# Patient Record
Sex: Female | Born: 1975 | Race: White | Hispanic: No | Marital: Married | Smoking: Current every day smoker
Health system: Southern US, Community
[De-identification: ages and names within clinical notes are randomized; demographics above are authoritative.]

## PROBLEM LIST (undated history)

## (undated) DIAGNOSIS — Z87898 Personal history of other specified conditions: Secondary | ICD-10-CM

## (undated) DIAGNOSIS — T883XXA Malignant hyperthermia due to anesthesia, initial encounter: Secondary | ICD-10-CM

## (undated) HISTORY — PX: VENTRICULOPERITONEAL SHUNT: SHX204

---

## 2004-09-06 ENCOUNTER — Inpatient Hospital Stay (HOSPITAL_COMMUNITY): Admission: AD | Admit: 2004-09-06 | Discharge: 2004-09-08 | Payer: Self-pay | Admitting: Obstetrics

## 2012-05-21 ENCOUNTER — Encounter (HOSPITAL_COMMUNITY): Payer: Self-pay | Admitting: *Deleted

## 2012-05-21 ENCOUNTER — Emergency Department (HOSPITAL_COMMUNITY)
Admission: EM | Admit: 2012-05-21 | Discharge: 2012-05-21 | Disposition: A | Discharge status: Home / Self Care | Payer: Self-pay | Attending: Emergency Medicine | Admitting: Emergency Medicine

## 2012-05-21 DIAGNOSIS — T148XXA Other injury of unspecified body region, initial encounter: Secondary | ICD-10-CM | POA: Insufficient documentation

## 2012-05-21 DIAGNOSIS — F17200 Nicotine dependence, unspecified, uncomplicated: Secondary | ICD-10-CM | POA: Insufficient documentation

## 2012-05-21 DIAGNOSIS — Y9289 Other specified places as the place of occurrence of the external cause: Secondary | ICD-10-CM | POA: Insufficient documentation

## 2012-05-21 DIAGNOSIS — Z982 Presence of cerebrospinal fluid drainage device: Secondary | ICD-10-CM | POA: Insufficient documentation

## 2012-05-21 DIAGNOSIS — X500XXA Overexertion from strenuous movement or load, initial encounter: Secondary | ICD-10-CM | POA: Insufficient documentation

## 2012-05-21 DIAGNOSIS — Z87898 Personal history of other specified conditions: Secondary | ICD-10-CM | POA: Insufficient documentation

## 2012-05-21 DIAGNOSIS — Y9389 Activity, other specified: Secondary | ICD-10-CM | POA: Insufficient documentation

## 2012-05-21 HISTORY — DX: Personal history of other specified conditions: Z87.898

## 2012-05-21 LAB — CBC
MCHC: 33.8 g/dL (ref 30.0–36.0)
RDW: 13.6 % (ref 11.5–15.5)

## 2012-05-21 LAB — COMPREHENSIVE METABOLIC PANEL
ALT: 40 U/L — ABNORMAL HIGH (ref 0–35)
Albumin: 4 g/dL (ref 3.5–5.2)
Alkaline Phosphatase: 59 U/L (ref 39–117)
BUN: 5 mg/dL — ABNORMAL LOW (ref 6–23)
Potassium: 3.8 mEq/L (ref 3.5–5.1)
Sodium: 138 mEq/L (ref 135–145)
Total Protein: 7.4 g/dL (ref 6.0–8.3)

## 2012-05-21 MED ORDER — DIAZEPAM 5 MG PO TABS: 5.0000 mg | ORAL_TABLET | Freq: Four times a day (QID) | ORAL | Status: DC | PRN

## 2012-05-21 MED ORDER — DIAZEPAM 5 MG PO TABS
5.0000 mg | ORAL_TABLET | Freq: Once | ORAL | Status: AC
  Administered 2012-05-21: 5 mg via ORAL
  Filled 2012-05-21: qty 1

## 2012-05-21 NOTE — ED Notes (Signed)
Pt stated that pain in r/lower abd. Is at an 8

## 2012-05-21 NOTE — ED Provider Notes (Signed)
History     CSN: [id number]  Arrival date & time 05/21/12  1013   First MD Initiated Contact with Patient 05/21/12 1051      Chief Complaint  Patient presents with  . Flank Pain    (Consider location/radiation/quality/duration/timing/severity/associated sxs/prior treatment) HPI  Danielle Hayden is a 37 y.o. female complaining of right lateral abdominal pain in both the upper and lower quadrant radiating around to the mid axillary line intermittently over the course of the last 4 days. Pain is described as sharp, severe at 8/10 is exacerbated by movement and lifting. Patient denies any fever, nausea vomiting, postprandial pain exacerbation, dysuria or frequency or change in bowel habits, cough or chest pain.  Past Medical History  Diagnosis Date  . H/O malignant hyperthermia     Past Surgical History  Procedure Laterality Date  . Ventriculoperitoneal shunt      No family history on file.  History  Substance Use Topics  . Smoking status: Current Every Day Smoker  . Smokeless tobacco: Not on file  . Alcohol Use: Yes     Comment: occasionally    OB History   Grav Para Term Preterm Abortions TAB SAB Ect Mult Living                  Review of Systems  Constitutional: Negative for fever.  Respiratory: Negative for shortness of breath.   Cardiovascular: Negative for chest pain.  Gastrointestinal: Negative for nausea, vomiting, abdominal pain and diarrhea.  All other systems reviewed and are negative.    Allergies  Review of patient's allergies indicates no known allergies.  Home Medications   Current Outpatient Rx  Name  Route  Sig  Dispense  Refill  . ibuprofen (ADVIL,MOTRIN) 200 MG tablet   Oral   Take 400 mg by mouth every 6 (six) hours as needed for pain (as needed for pain).         . diazepam (VALIUM) 5 MG tablet   Oral   Take 1 tablet (5 mg total) by mouth every 6 (six) hours as needed for anxiety (spasms).   10 tablet   0     BP 139/86  Pulse  85  Temp(Src) 97.9 F (36.6 C) (Oral)  Resp 18  SpO2 99%  Physical Exam  Nursing note and vitals reviewed. Constitutional: She is oriented to person, place, and time. She appears well-developed and well-nourished. No distress.  HENT:  Head: Normocephalic.  Mouth/Throat: Oropharynx is clear and moist.  Eyes: Conjunctivae and EOM are normal. Pupils are equal, round, and reactive to light.  Neck: Normal range of motion.  Cardiovascular: Normal rate, regular rhythm and normal heart sounds.   Pulmonary/Chest: Effort normal and breath sounds normal. No stridor. No respiratory distress. She has no wheezes. She has no rales. She exhibits no tenderness.  Abdominal: Soft. Bowel sounds are normal. She exhibits no distension and no mass. There is no tenderness. There is no rebound and no guarding.    Genitourinary:  NO CVA tenderness bilaterally   Musculoskeletal: Normal range of motion.  Neurological: She is alert and oriented to person, place, and time.  Psychiatric: She has a normal mood and affect.    ED Course  Procedures (including critical care time)  Labs Reviewed  CBC - Abnormal; Notable for the following:    WBC 11.9 (*)    All other components within normal limits  COMPREHENSIVE METABOLIC PANEL - Abnormal; Notable for the following:    BUN 5 (*)  ALT 40 (*)    Total Bilirubin 0.2 (*)    All other components within normal limits   No results found.   1. Muscle strain       MDM  Patient with diffuse tenderness to palpation of the far lateral right right-sided abdomen both upper and lower quadrants. Patient has no associated symptoms. The pain to be superficial and exacerbated by movement.   Blood work shows mild leukocytosis of 11.9. Liver function tests and alkaline phosphataseand  T. bili are normal.   Pt verbalized understanding and agrees with care plan. Outpatient follow-up and return precautions given.    Discharge Medication List as of 05/21/2012 12:46 PM     START taking these medications   Details  diazepam (VALIUM) 5 MG tablet Take 1 tablet (5 mg total) by mouth every 6 (six) hours as needed for anxiety (spasms)., Starting 05/21/2012, Until Discontinued, Print              Wynetta Emery, PA-C 05/21/12 [address] PA-C 05/21/12 1558

## 2012-05-21 NOTE — Progress Notes (Signed)
During [facility] ED visit referral to [facility] community care liaison who spoke with pt to confirm no pcp, no coverage and [place] resident She discussed and provided written information for list of self pay [place] pcps and affordable care act marketplace enrollment information   

## 2012-05-21 NOTE — ED Notes (Signed)
Pt reports a stabbing, cramping feeling in R side/flank. Started Wednesday. Got better and went back to work today and when lifting kids, pain returned. Pain worse with a deep breath. Denies known injury, denies urinary symptoms.

## 2012-05-23 NOTE — ED Provider Notes (Signed)
Medical screening examination/treatment/procedure(s) were performed by non-physician practitioner and as supervising physician I was immediately available for consultation/collaboration.   Suzi Roots, MD 05/23/12 [phone]

## 2013-01-14 ENCOUNTER — Emergency Department (HOSPITAL_COMMUNITY): Payer: Self-pay

## 2013-01-14 ENCOUNTER — Encounter (HOSPITAL_COMMUNITY): Payer: Self-pay | Admitting: Emergency Medicine

## 2013-01-14 ENCOUNTER — Emergency Department (HOSPITAL_COMMUNITY)
Admission: EM | Admit: 2013-01-14 | Discharge: 2013-01-15 | Disposition: A | Discharge status: Home / Self Care | Payer: Self-pay | Attending: Emergency Medicine | Admitting: Emergency Medicine

## 2013-01-14 DIAGNOSIS — K219 Gastro-esophageal reflux disease without esophagitis: Secondary | ICD-10-CM | POA: Insufficient documentation

## 2013-01-14 DIAGNOSIS — R109 Unspecified abdominal pain: Secondary | ICD-10-CM

## 2013-01-14 DIAGNOSIS — Z3202 Encounter for pregnancy test, result negative: Secondary | ICD-10-CM | POA: Insufficient documentation

## 2013-01-14 DIAGNOSIS — F17200 Nicotine dependence, unspecified, uncomplicated: Secondary | ICD-10-CM | POA: Insufficient documentation

## 2013-01-14 HISTORY — DX: Malignant hyperthermia due to anesthesia, initial encounter: T88.3XXA

## 2013-01-14 LAB — COMPREHENSIVE METABOLIC PANEL
ALT: 47 U/L — ABNORMAL HIGH (ref 0–35)
AST: 33 U/L (ref 0–37)
Albumin: 4.5 g/dL (ref 3.5–5.2)
BUN: 10 mg/dL (ref 6–23)
CO2: 21 mEq/L (ref 19–32)
Calcium: 9.5 mg/dL (ref 8.4–10.5)
Creatinine, Ser: 0.71 mg/dL (ref 0.50–1.10)
GFR calc Af Amer: >90 mL/min (ref >90)
GFR calc non Af Amer: >90 mL/min (ref >90)
Glucose, Bld: 180 mg/dL — ABNORMAL HIGH (ref 70–99)
Sodium: 136 mEq/L (ref 135–145)
Total Protein: 7.8 g/dL (ref 6.0–8.3)

## 2013-01-14 LAB — URINE MICROSCOPIC-ADD ON

## 2013-01-14 LAB — CBC WITH DIFFERENTIAL/PLATELET
Basophils Absolute: 0 10*3/uL (ref 0.0–0.1)
Basophils Relative: 0 % (ref 0–1)
Eosinophils Absolute: 0 10*3/uL (ref 0.0–0.7)
Eosinophils Relative: 0 % (ref 0–5)
HCT: 43.1 % (ref 36.0–46.0)
MCH: 32 pg (ref 26.0–34.0)
MCHC: 35 g/dL (ref 30.0–36.0)
MCV: 91.3 fL (ref 78.0–100.0)
Monocytes Absolute: 0.7 10*3/uL (ref 0.1–1.0)
Neutrophils Relative %: 87 % — ABNORMAL HIGH (ref 43–77)
Platelets: 419 10*3/uL — ABNORMAL HIGH (ref 150–400)
RBC: 4.72 MIL/uL (ref 3.87–5.11)
RDW: 13.3 % (ref 11.5–15.5)

## 2013-01-14 LAB — URINALYSIS, ROUTINE W REFLEX MICROSCOPIC
Glucose, UA: NEGATIVE mg/dL
Hgb urine dipstick: NEGATIVE
Ketones, ur: >80 mg/dL — AB
Protein, ur: 30 mg/dL — AB
Specific Gravity, Urine: 1.032 — ABNORMAL HIGH (ref 1.005–1.030)
Urobilinogen, UA: 1 mg/dL (ref 0.0–1.0)

## 2013-01-14 LAB — POCT PREGNANCY, URINE: Preg Test, Ur: NEGATIVE

## 2013-01-14 MED ORDER — ONDANSETRON HCL 4 MG/2ML IJ SOLN
4.0000 mg | Freq: Once | INTRAMUSCULAR | Status: AC
  Administered 2013-01-14: 4 mg via INTRAVENOUS
  Filled 2013-01-14: qty 2

## 2013-01-14 MED ORDER — HYDROMORPHONE HCL PF 1 MG/ML IJ SOLN
1.0000 mg | Freq: Once | INTRAMUSCULAR | Status: AC
  Administered 2013-01-15: 1 mg via INTRAVENOUS
  Filled 2013-01-14: qty 1

## 2013-01-14 MED ORDER — GI COCKTAIL ~~LOC~~
30.0000 mL | Freq: Once | ORAL | Status: AC
  Administered 2013-01-15: 30 mL via ORAL
  Filled 2013-01-14: qty 30

## 2013-01-14 MED ORDER — PANTOPRAZOLE SODIUM 40 MG IV SOLR
40.0000 mg | INTRAVENOUS | Status: AC
  Administered 2013-01-15: 40 mg via INTRAVENOUS
  Filled 2013-01-14: qty 40

## 2013-01-14 MED ORDER — HYDROMORPHONE HCL PF 1 MG/ML IJ SOLN
1.0000 mg | Freq: Once | INTRAMUSCULAR | Status: AC
  Administered 2013-01-14: 1 mg via INTRAVENOUS
  Filled 2013-01-14: qty 1

## 2013-01-14 MED ORDER — SODIUM CHLORIDE 0.9 % IV BOLUS (SEPSIS)
1000.0000 mL | Freq: Once | INTRAVENOUS | Status: AC
  Administered 2013-01-14: 1000 mL via INTRAVENOUS

## 2013-01-14 NOTE — Progress Notes (Signed)
Patient confirms that she does not have a pcp or insurance.  EDCM providedpatient a list of pcps who accept selfpay patients, list of discounted pharmacies and website needymeds.org for medication assistance, financial assistance in the community such as [employer], information regarding Affordable Care act and Medicaid for insurance, crisis intervention information.  EDCM placed this infromation with her belongings on the chair next to her bed.

## 2013-01-14 NOTE — ED Notes (Signed)
Per pt's husband, the pt began vomiting and having pain in her middle chest area at around [facility] today. Pt states she has been vomiting every 5 minutes for the past 5 hours. Pt states her hand's have also become numb within the last hour. Denies SOB.

## 2013-01-14 NOTE — ED Provider Notes (Signed)
CSN: [id number]     Arrival date & time 01/14/13  1908 History   First MD Initiated Contact with Patient 01/14/13 1935     Chief Complaint  Patient presents with  . Emesis   (Consider location/radiation/quality/duration/timing/severity/associated sxs/prior Treatment) HPI Comments: Patient presents to the emergency department with chief complaint of nausea, vomiting, and upper abdominal pain that began around 2 PM today. Patient states the pain is severe. She states that she has been vomiting continuously. She denies any hematemesis or hematochezia. States her last menstrual period was 8 years ago. She has IUD. She denies any other, surgeries. 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 She does not have a primary care provider. She denies any other symptoms at this time. She has not tried taking anything to alleviate her symptoms.  The history is provided by the patient. No language interpreter was used.    Past Medical History  Diagnosis Date  . Malignant hyperthermia    History reviewed. No pertinent past surgical history. No family history on file. History  Substance Use Topics  . Smoking status: Current Every Day Smoker -- 0.50 packs/day    Types: Cigarettes  . Smokeless tobacco: Not on file  . Alcohol Use: Yes   OB History   Grav Para Term Preterm Abortions TAB SAB Ect Mult Living                 Review of Systems  All other systems reviewed and are negative.    Allergies  Review of patient's allergies indicates no known allergies.  Home Medications   Current Outpatient Rx  Name  Route  Sig  Dispense  Refill  . ibuprofen (ADVIL,MOTRIN) 200 MG tablet   Oral   Take 200 mg by mouth every 6 (six) hours as needed for pain.          BP 139/97  Pulse 66  Temp(Src) 97.6 F (36.4 C) (Oral)  Resp 20 Physical Exam  Nursing note and vitals reviewed. Constitutional: She is oriented to person, place, and time. She appears  well-developed and well-nourished.  HENT:  Head: Normocephalic and atraumatic.  Eyes: Conjunctivae and EOM are normal. Pupils are equal, round, and reactive to light.  Neck: Normal range of motion. Neck supple.  Cardiovascular: Normal rate and regular rhythm.  Exam reveals no gallop and no friction rub.   No murmur heard. Pulmonary/Chest: Effort normal and breath sounds normal. No respiratory distress. She has no wheezes. She has no rales. She exhibits no tenderness.  Abdominal: Soft. Bowel sounds are normal. She exhibits no distension and no mass. There is tenderness. There is no rebound and no guarding.  Right upper quadrant tenderness palpation, no other focal abdominal tenderness, no pain at McBurney's point, no fluid wave or signs of peritonitis  Musculoskeletal: Normal range of motion. She exhibits no edema and no tenderness.  Neurological: She is alert and oriented to person, place, and time.  Skin: Skin is warm and dry.  Psychiatric: She has a normal mood and affect. Her behavior is normal. Judgment and thought content normal.    ED Course  Procedures (including critical care time) Results for orders placed during the hospital encounter of 01/14/13  URINALYSIS, ROUTINE W REFLEX MICROSCOPIC      Result Value Range   Color, Urine AMBER (*) YELLOW   APPearance CLOUDY (*) CLEAR   Specific Gravity, Urine 1.032 (*) 1.005 - 1.030   pH 8.0  5.0 - 8.0   Glucose, UA NEGATIVE  NEGATIVE mg/dL   Hgb urine dipstick NEGATIVE  NEGATIVE   Bilirubin Urine SMALL (*) NEGATIVE   Ketones, ur >80 (*) NEGATIVE mg/dL   Protein, ur 30 (*) NEGATIVE mg/dL   Urobilinogen, UA 1.0  0.0 - 1.0 mg/dL   Nitrite NEGATIVE  NEGATIVE   Leukocytes, UA SMALL (*) NEGATIVE  CBC WITH DIFFERENTIAL      Result Value Range   WBC 16.9 (*) 4.0 - 10.5 K/uL   RBC 4.72  3.87 - 5.11 MIL/uL   Hemoglobin 15.1 (*) 12.0 - 15.0 g/dL   HCT 16.1  09.6 - 04.5 %   MCV 91.3  78.0 - 100.0 fL   MCH 32.0  26.0 - 34.0 pg   MCHC 35.0   30.0 - 36.0 g/dL   RDW 40.9  81.1 - 91.4 %   Platelets 419 (*) 150 - 400 K/uL   Neutrophils Relative % 87 (*) 43 - 77 %   Neutro Abs 14.7 (*) 1.7 - 7.7 K/uL   Lymphocytes Relative 9 (*) 12 - 46 %   Lymphs Abs 1.5  0.7 - 4.0 K/uL   Monocytes Relative 4  3 - 12 %   Monocytes Absolute 0.7  0.1 - 1.0 K/uL   Eosinophils Relative 0  0 - 5 %   Eosinophils Absolute 0.0  0.0 - 0.7 K/uL   Basophils Relative 0  0 - 1 %   Basophils Absolute 0.0  0.0 - 0.1 K/uL  COMPREHENSIVE METABOLIC PANEL      Result Value Range   Sodium 136  135 - 145 mEq/L   Potassium 3.4 (*) 3.5 - 5.1 mEq/L   Chloride 102  96 - 112 mEq/L   CO2 21  19 - 32 mEq/L   Glucose, Bld 180 (*) 70 - 99 mg/dL   BUN 10  6 - 23 mg/dL   Creatinine, Ser 7.82  0.50 - 1.10 mg/dL   Calcium 9.5  8.4 - 95.6 mg/dL   Total Protein 7.8  6.0 - 8.3 g/dL   Albumin 4.5  3.5 - 5.2 g/dL   AST 33  0 - 37 U/L   ALT 47 (*) 0 - 35 U/L   Alkaline Phosphatase 67  39 - 117 U/L   Total Bilirubin 1.4 (*) 0.3 - 1.2 mg/dL   GFR calc non Af Amer >90  >90 mL/min   GFR calc Af Amer >90  >90 mL/min  LIPASE, BLOOD      Result Value Range   Lipase 24  11 - 59 U/L  URINE MICROSCOPIC-ADD ON      Result Value Range   Squamous Epithelial / LPF MANY (*) RARE   WBC, UA 3-6  <3 WBC/hpf   Bacteria, UA MANY (*) RARE   Urine-Other MUCOUS PRESENT    POCT PREGNANCY, URINE      Result Value Range   Preg Test, Ur NEGATIVE  NEGATIVE   Dg Abd Acute W/chest  01/14/2013   CLINICAL DATA:  Abdominal pain with nausea and vomiting  EXAM: ACUTE ABDOMEN SERIES (ABDOMEN 2 VIEW & CHEST 1 VIEW)  COMPARISON:  None.  FINDINGS: Lungs clear and well aerated. No effusion or pneumothorax. Normal heart size.  Extensive dystrophic calcification associated with a shunt catheter traversing the right neck and chest, ending in the region of the left lower quadrant. Questionable continuity at the level of the diaphragm, limited due to technique.  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  IUD which is oriented horizontally, with the crossbar directed to the right.  No abnormal intra-abdominal mass effect or calcification. Nonobstructive bowel gas pattern. No pneumoperitoneum.  IMPRESSION: 1. No evidence of acute cardiopulmonary disease. 2. Nonobstructive bowel gas pattern. 3. Unusual orientation of an IUD which may be related to rotation of the uterus, but should be correlated with pelvic symptomatology. 4. Ventriculoperitoneal shunt catheter with extensive dystrophic calcification. Uncertain catheter continuity at the level the diaphragm.   Electronically Signed   By: Tiburcio Pea M.D.   On: 01/14/2013 23:15      MDM   1. Abdominal  pain, other specified site   2. GERD (gastroesophageal reflux disease)     Patient with nausea and vomiting, as well as epigastric pain. Pain is not in the chest, but is in the epigastric region.  Suspect that the pain is GERD/PUD related, but will check labs, lipase, and LFTs.  Will treat pain in the ED.  Labs are reassuring. WBC is elevated, suspect from dehydration and stress.  No focal infectious process on exam. Patient is well-appearing. Pain is reduced to the emergency department. Could be biliary colic, but no elevated enzymes, no further workup tonight.  Will treat with GI cocktail, protonix and discharge with PPI.    Discussed the patient with Dr. Fayrene Fearing, who agrees with the plan.     Roxy Horseman, PA-C 01/15/13 [phone]

## 2013-01-15 MED ORDER — OXYCODONE-ACETAMINOPHEN 5-325 MG PO TABS: 2.0000 | ORAL_TABLET | Freq: Four times a day (QID) | ORAL | Status: DC | PRN

## 2013-01-15 MED ORDER — OXYCODONE-ACETAMINOPHEN 5-325 MG PO TABS
2.0000 | ORAL_TABLET | Freq: Once | ORAL | Status: AC
  Administered 2013-01-15: 2 via ORAL
  Filled 2013-01-15: qty 2

## 2013-01-15 MED ORDER — ONDANSETRON HCL 4 MG PO TABS: 4.0000 mg | ORAL_TABLET | Freq: Four times a day (QID) | ORAL | Status: DC

## 2013-01-15 MED ORDER — OMEPRAZOLE 20 MG PO CPDR: 20.0000 mg | DELAYED_RELEASE_CAPSULE | Freq: Every day | ORAL | Status: DC

## 2013-01-15 MED ORDER — PANTOPRAZOLE SODIUM 40 MG IV SOLR
INTRAVENOUS | Status: AC
  Filled 2013-01-15: qty 40

## 2013-01-15 MED ORDER — ONDANSETRON 8 MG PO TBDP
ORAL_TABLET | ORAL | Status: AC
  Filled 2013-01-15: qty 1

## 2013-01-16 LAB — URINE CULTURE

## 2013-01-17 NOTE — ED Provider Notes (Signed)
Medical screening examination/treatment/procedure(s) were performed by non-physician practitioner and as supervising physician I was immediately available for consultation/collaboration.   Beuford Garcilazo J Fabricio Endsley, MD 01/17/13 1401 

## 2013-12-19 ENCOUNTER — Encounter (HOSPITAL_COMMUNITY): Payer: Self-pay | Admitting: Emergency Medicine

## 2013-12-19 ENCOUNTER — Emergency Department (HOSPITAL_COMMUNITY)
Admission: EM | Admit: 2013-12-19 | Discharge: 2013-12-19 | Disposition: A | Discharge status: Home / Self Care | Payer: No Typology Code available for payment source | Attending: Emergency Medicine | Admitting: Emergency Medicine

## 2013-12-19 DIAGNOSIS — K029 Dental caries, unspecified: Secondary | ICD-10-CM | POA: Insufficient documentation

## 2013-12-19 DIAGNOSIS — K0889 Other specified disorders of teeth and supporting structures: Secondary | ICD-10-CM

## 2013-12-19 DIAGNOSIS — F17200 Nicotine dependence, unspecified, uncomplicated: Secondary | ICD-10-CM | POA: Insufficient documentation

## 2013-12-19 DIAGNOSIS — K089 Disorder of teeth and supporting structures, unspecified: Secondary | ICD-10-CM | POA: Insufficient documentation

## 2013-12-19 MED ORDER — HYDROCODONE-ACETAMINOPHEN 5-325 MG PO TABS: 1.0000 | ORAL_TABLET | Freq: Four times a day (QID) | ORAL | Status: DC | PRN

## 2013-12-19 MED ORDER — BUPIVACAINE-EPINEPHRINE (PF) 0.5% -1:200000 IJ SOLN
1.8000 mL | Freq: Once | INTRAMUSCULAR | Status: AC
  Administered 2013-12-19: 1.8 mL

## 2013-12-19 MED ORDER — OXYCODONE-ACETAMINOPHEN 5-325 MG PO TABS
1.0000 | ORAL_TABLET | Freq: Once | ORAL | Status: AC
  Administered 2013-12-19: 1 via ORAL
  Filled 2013-12-19: qty 1

## 2013-12-19 MED ORDER — PENICILLIN V POTASSIUM 500 MG PO TABS: 500.0000 mg | ORAL_TABLET | Freq: Four times a day (QID) | ORAL | Status: AC

## 2013-12-19 NOTE — ED Provider Notes (Signed)
CSN: [id number]     Arrival date & time 12/19/13  0014 History   First MD Initiated Contact with Patient 12/19/13 0053     Chief Complaint  Patient presents with  . Dental Pain    (Consider location/radiation/quality/duration/timing/severity/associated sxs/prior Treatment) Patient is a 38 y.o. female presenting with tooth pain. The history is provided by the patient. No language interpreter was used.  Dental Pain Location:  Lower Lower teeth location:  19/LL 1st molar Quality:  Aching and sharp Severity:  Moderate Onset quality:  Sudden Duration:  1 day Timing:  Constant Progression:  Waxing and waning Chronicity:  Recurrent Context: dental caries and poor dentition   Context: not abscess and not trauma   Relieved by:  Nothing Worsened by:  Touching and pressure Ineffective treatments:  NSAIDs Associated symptoms: no difficulty swallowing, no drooling, no facial pain, no facial swelling, no fever, no gum swelling, no oral bleeding, no oral lesions and no trismus   Risk factors: lack of dental care and smoking     Past Medical History  Diagnosis Date  . Malignant hyperthermia    History reviewed. No pertinent past surgical history. History reviewed. No pertinent family history. History  Substance Use Topics  . Smoking status: Current Every Day Smoker -- 0.50 packs/day    Types: Cigarettes  . Smokeless tobacco: Not on file  . Alcohol Use: Yes   OB History   Grav Para Term Preterm Abortions TAB SAB Ect Mult Living                  Review of Systems  Constitutional: Negative for fever.  HENT: Positive for dental problem. Negative for drooling, facial swelling and mouth sores.   All other systems reviewed and are negative.   Allergies  Review of patient's allergies indicates no known allergies.  Home Medications   Prior to Admission medications   Medication Sig Start Date End Date Taking? Authorizing Provider  ibuprofen (ADVIL,MOTRIN) 200 MG tablet Take 600 mg  by mouth every 6 (six) hours as needed for moderate pain.    Yes Historical Provider, MD  HYDROcodone-acetaminophen (NORCO/VICODIN) 5-325 MG per tablet Take 1-2 tablets by mouth every 6 (six) hours as needed for moderate pain or severe pain. 12/19/13   Antony Madura, PA-C  penicillin v potassium (VEETID) 500 MG tablet Take 1 tablet (500 mg total) by mouth 4 (four) times daily. 12/19/13 12/26/13  Antony Madura, PA-C   BP 120/95  Pulse 101  Temp(Src) 98.5 F (36.9 C) (Oral)  Resp 20  SpO2 98%  Physical Exam  Nursing note and vitals reviewed. Constitutional: She is oriented to person, place, and time. She appears well-developed and well-nourished. No distress.  Nontoxic/nonseptic appearing  HENT:  Head: Normocephalic and atraumatic.  Mouth/Throat: Uvula is midline, oropharynx is clear and moist and mucous membranes are normal. No trismus in the jaw. Dental caries present. No dental abscesses or uvula swelling.    Tenderness to palpation to left lower first molar. Mild surrounding gingival swelling without fluctuance. Patient tolerating secretions without difficulty. Uvula midline. No trismus.  Eyes: Conjunctivae and EOM are normal. No scleral icterus.  Neck: Normal range of motion.  Pulmonary/Chest: Effort normal. No respiratory distress.  Chest expansion symmetric  Musculoskeletal: Normal range of motion.  Neurological: She is alert and oriented to person, place, and time. She exhibits normal muscle tone. Coordination normal.  GCS 15. Patient moves extremities without ataxia  Skin: Skin is warm and dry. No rash noted. She is not  diaphoretic. No erythema. No pallor.  Psychiatric: She has a normal mood and affect. Her behavior is normal.    ED Course  Dental Date/Time: 12/19/2013 1:16 AM Performed by: Antony Madura Authorized by: Antony Madura Consent: Verbal consent obtained. written consent not obtained. The procedure was performed in an emergent situation. Risks and benefits: risks,  benefits and alternatives were discussed Consent given by: patient Patient understanding: patient states understanding of the procedure being performed Patient consent: the patient's understanding of the procedure matches consent given Procedure consent: procedure consent matches procedure scheduled Relevant documents: relevant documents present and verified Test results: test results available and properly labeled Site marked: the operative site was marked Imaging studies: imaging studies available Required items: required blood products, implants, devices, and special equipment available Patient identity confirmed: verbally with patient and arm band Time out: Immediately prior to procedure a "time out" was called to verify the correct patient, procedure, equipment, support staff and site/side marked as required. Preparation: Patient was prepped and draped in the usual sterile fashion. Local anesthesia used: yes Anesthesia: local infiltration Local anesthetic: bupivacaine 0.5% with epinephrine Anesthetic total: 1.3 ml Patient sedated: no Patient tolerance: Patient tolerated the procedure well with no immediate complications.   (including critical care time) Labs Review Labs Reviewed - No data to display  Imaging Review No results found.   EKG Interpretation None      MDM   Final diagnoses:  Dentalgia    Patient with toothache. No gross abscess. Exam unconcerning for Ludwig's angina or spread of infection. Pain treated in ED with dental block. Will treat with penicillin and pain medicine. Urged patient to follow-up with dentist. Resource guide provided and return precautions discussed. Patient agreeable to plan with no unaddressed concerns.   Filed Vitals:   12/19/13 0023  BP: 120/95  Pulse: 101  Temp: 98.5 F (36.9 C)  TempSrc: Oral  Resp: 20  SpO2: 98%     Antony Madura, PA-C 12/19/13 [phone]

## 2013-12-19 NOTE — ED Notes (Signed)
Pt complains of dental pain all day

## 2013-12-19 NOTE — Discharge Instructions (Signed)
Dental Pain °A tooth ache may be caused by cavities (tooth decay). Cavities expose the nerve of the tooth to air and hot or cold temperatures. It may come from an infection or abscess (also called a boil or furuncle) around your tooth. It is also often caused by dental caries (tooth decay). This causes the pain you are having. °DIAGNOSIS  °Your caregiver can diagnose this problem by exam. °TREATMENT  °· If caused by an infection, it may be treated with medications which kill germs (antibiotics) and pain medications as prescribed by your caregiver. Take medications as directed. °· Only take over-the-counter or prescription medicines for pain, discomfort, or fever as directed by your caregiver. °· Whether the tooth ache today is caused by infection or dental disease, you should see your dentist as soon as possible for further care. °SEEK MEDICAL CARE IF: °The exam and treatment you received today has been provided on an emergency basis only. This is not a substitute for complete medical or dental care. If your problem worsens or new problems (symptoms) appear, and you are unable to meet with your dentist, call or return to this location. °SEEK IMMEDIATE MEDICAL CARE IF:  °· You have a fever. °· You develop redness and swelling of your face, jaw, or neck. °· You are unable to open your mouth. °· You have severe pain uncontrolled by pain medicine. °MAKE SURE YOU:  °· Understand these instructions. °· Will watch your condition. °· Will get help right away if you are not doing well or get worse. °Document Released: 03/21/2005 Document Revised: 06/13/2011 Document Reviewed: 11/07/2007 °ExitCare® Patient Information ©2015 [employer]. This information is not intended to replace advice given to you by your health care provider. Make sure you discuss any questions you have with your health care provider. ° °Emergency Department Resource Guide °1) Find a Doctor and Pay Out of Pocket °Although you won't have to find out who  is covered by your insurance plan, it is a good idea to ask around and get recommendations. You will then need to call the office and see if the doctor you have chosen will accept you as a new patient and what types of options they offer for patients who are self-pay. Some doctors offer discounts or will set up payment plans for their patients who do not have insurance, but you will need to ask so you aren't surprised when you get to your appointment. ° °2) Contact Your Local Health Department °Not all health departments have doctors that can see patients for sick visits, but many do, so it is worth a call to see if yours does. If you don't know where your local health department is, you can check in your phone book. The CDC also has a tool to help you locate your state's health department, and many state websites also have listings of all of their local health departments. ° °3) Find a Walk-in Clinic °If your illness is not likely to be very severe or complicated, you may want to try a walk in clinic. These are popping up all over the country in pharmacies, drugstores, and shopping centers. They're usually staffed by nurse practitioners or physician assistants that have been trained to treat common illnesses and complaints. They're usually fairly quick and inexpensive. However, if you have serious medical issues or chronic medical problems, these are probably not your best option. ° °No Primary Care Doctor: °- Call [facility] at  832-8000 - they can help you locate a primary   care doctor that  accepts your insurance, provides certain services, etc. °- Physician Referral Service- 1-800-533-3463 ° °Chronic Pain Problems: °Organization         Address  Phone   Notes  °[facility]  (336) 297-2271 Patients need to be referred by their primary care doctor.  ° °Medication Assistance: °Organization         Address  Phone   Notes  °[place] [facility] [address]  [address] °[place], [place] [address] (336) 641-8030 --Must be a resident of [place] °-- Must have NO insurance coverage whatsoever (no Medicaid/ Medicare, etc.) °-- The pt. MUST have a primary care doctor that directs their care regularly and follows them in the community °  °[facility]  (866) 331-1348   °[employer]  (888) 892-1162   ° °Agencies that provide inexpensive medical care: °Organization         Address  Phone   Notes  °[facility]  (336) 832-8035   °[facility]    (336) 832-7272   °[facility] [address] °[place], [place] [address] (336) 832-4777   °[facility] [address], °[place] (336) 271-4999   °[facility]    (336) 373-0678   °[facility]    (336) 272-1050   °[facility] ° [address], [place] Phone:  (336) 832-4444, Fax:  (336) 832-4440 Hours of Operation:  9 am - 6 pm, M-F.  Also accepts Medicaid/Medicare and self-pay.  °[facility] ° [address], [place] Phone: (336) 832-3150, Fax: (336) 832-3151. Hours of Operation:  8:30 am - 5:30 pm, M-F.  Also accepts Medicaid and self-pay.  °[facility] [place] [address], [place] Phone: (336) 878-6027   °[facility] [address], [place], [place] (336)723-1848, Ext. 123 Mondays & Thursdays: 7-9 AM.  First 15 patients are seen on a first come, first serve basis. °  ° °Medicaid-accepting [place] Providers: ° °Organization         Address  Phone   Notes  °[facility] [address], [place] (336) 641-2100 Also accepts self-pay patients.  °[facility] [address], [place] ° (336) 856-9996   °[facility] [address], [place] (336) 288-8857   °[facility] [address] [place] [address], [place] (336) 299-7000   °Veita Bland [address], [place]  ° (336) 373-1557 Only accepts [place] Access Medicaid patients after they have their name applied to their card.  ° °Self-Pay (no insurance) in [place]: ° °Organization         Address  Phone   Notes  °Sickle Cell Patients, [facility] [address], [place] (336) 832-1970   °[facility] [address], [place] (336) 832-4400   °[facility] ° [address], [place] (336) 992-4800   °[facility]/Dr. Osei-Bonsu ° [address] [place] [address], [place] or [address], [place] (336) 841-8500 Phone number for both [place] and [place] locations is the same.  °[facility] [address], [place] (336) 299-0000   °[facility] [address] [place] [address],  or [address] (336) 852-7530 °(336) 878-2260   °  [facility] [address], [place] (336) 350-1642, phone; (336) 294-5005, fax Sees patients 1st and 3rd Saturday of every month.  Must not qualify for public or private insurance (i.e. Medicaid, Medicare, [place] Health Choice, Veterans' Benefits) • Household income should be no more than 200% of the poverty level •The clinic cannot treat you if you are pregnant or think you are pregnant • Sexually transmitted diseases are not treated at the clinic.  ° ° °Dental Care: °Organization         Address  Phone  Notes  °[place] [facility] [address], [place] (336) 641-6152 Accepts children up to age 21 who are enrolled in Medicaid or [place] Health Choice; pregnant women with a Medicaid card; and children who have applied for Medicaid or [place] Health Choice, but were declined, whose parents can pay a reduced fee at time of service.  °[facility] [place]  [address], [place] (336) 641-7733 Accepts children up to age 21 who are  enrolled in Medicaid or [place] Health Choice; pregnant women with a Medicaid card; and children who have applied for Medicaid or [place] Health Choice, but were declined, whose parents can pay a reduced fee at time of service.  °[facility] ° [address], [place] (336) 641-4533 Patients are seen by appointment only. Walk-ins are not accepted. [facility] will see patients 18 years of age and older. °Monday - Tuesday (8am-5pm) °Most Wednesdays (8:30-5pm) °$30 per visit, cash only  °[facility] ° [address], [place] (336) 641-4533 Patients are seen by appointment only. Walk-ins are not accepted. [facility] will see patients 18 years of age and older. °One Wednesday Evening (Monthly: Volunteer Based).  $30 per visit, cash only  °[facility]  (919) 537-3737 for adults; Children under age 4, call Graduate Pediatric Dentistry at (919) [phone]. Children aged 4-14, please call (919) 537-3737 to request a pediatric application. ° Dental services are provided in all areas of dental care including fillings, crowns and bridges, complete and partial dentures, implants, gum treatment, root canals, and extractions. Preventive care is also provided. Treatment is provided to both adults and children. °Patients are selected via a lottery and there is often a waiting list. °  °[facility] [address], °[place] ° (336) 763-8833 [email or web address] °  °[facility] [address], [place], [place] (336)723-1848, Ext. 123 Second and Fourth Thursday of each month, opens at 6:30 AM; Clinic ends at 9 AM.  Patients are seen on a first-come first-served basis, and a limited number are seen during each clinic.  ° °[facility] ° [address], [place], [place] (336) 723-7904   Eligibility Requirements °You must have lived in [place], [place], or [place] counties for at least the last three months. °  You  cannot be eligible for state or federal sponsored healthcare insurance, including Veterans Administration, Medicaid, or Medicare. °  You generally cannot be eligible for healthcare insurance through your employer.  °  How to apply: °Eligibility screenings are held every Tuesday and Wednesday afternoon from 1:00 pm until 4:00 pm. You do not need an appointment for the interview!  °[facility] [address], [place], [place] 336-631-2330   °[facility] [facility]  336-342-8273   °[facility]  336-703-3100   °[facility]  336-570-6415   ° °Behavioral Health Resources in the Community: °Intensive Outpatient Programs °Organization         Address  Phone  Notes  °[facility] [address], [place], [place] 336-878-6098   °[facility] [address], [place], [place] 336-832-9800   °[facility] [address], [place], [place] ° 336-882-2125   °[facility] [address],  °[place], [place] 1-800-853-5163 or 336-641-4981   °Substance Abuse Resources °Organization         Address  Phone  Notes  °[facility]  336-882-2125   °[facility]  336-784-9470   °[facility]  336-285-9073   °[facility]  336-845-3988   °Residential & Outpatient Substance Abuse Program  1-800-659-3381   °Psychological Services °Organization         Address  Phone  Notes  °[facility]  336- 832-9600   °[facility]  336- 378-7881   °[facility] [address], [place] 1-800-853-5163 or 336-641-4981   ° °Mobile Crisis Teams °Organization         Address  Phone  Notes  °[facility], Mobile Crisis Care Unit  1-877-626-1772   °Assertive °[facility] ° [address] [place], [place] 336-834-9664   °[facility] DeEsch [address] °[place] 336-554-5454   ° °Self-Help/Support  Groups °Organization         Address  Phone             Notes  °[facility] [place] - variety of support groups  336- 373-1402 Call for more information  °Narcotics Anonymous (NA), [facility] [address], °[place]  2 meetings at this location  ° °Residential Treatment Programs °Organization         Address  Phone  Notes  °[facility] [address],    °[place]  1-866-801-8205   °[facility] ° [address], [place], [place] 704-293-8524   °[facility] [address], [place] 336-845-3988 Admissions: 8am-3pm M-F  °[facility] [address]    °[place], [place] 336-841-1104   °[facility] [address], [place], [place] 336-379-7146   °[facility] [address]  °[place], [place] 336-285-9073   °[facility] Intensive Outpatient [address], [place], [place] 336-852-3033   °[facility] [address]  °[place], [place] 1-877-615-2722 or 336-784-9470   °[facility] [address] , [place] 336-227-7417 Accepts Medicaid  °[facility] [address]  °[place] 1-800-659-3381 Substance Abuse/Addiction Treatment  ° °[place] Behavioral Health Resources °Organization         Address  Phone  Notes  °[facility]  (888) 581-9988   °Julie Brannon, PhD [address] [place], [place]   (336) 349-5553 or (336) 951-0000   ° Behavioral   [address] °[place], [place] (336) 349-4454   °[facility] [address], [place], [place] (336) 342-8316 Insurance/Medicaid/sponsorship through [facility]  °[facility] [address]                                    [place], [place] (336) 342-8316 Therapy/tele-psych/case  °[facility]   [address]  ° [place], [place] (336) 349-2233    °Dr. Arfeen  (336) 349-4544   °[facility] [place]  [employer] [place] [facility] 1) [address], [place] °2) [address], [place] °3)  [address], [place] (336) 349-3220 °(336) 342-7768 ° °(336) 342-8140   °[place] [facility] (336) 342-1394 or (336) 342-3537 (After Hours)    ° ° ° °

## 2013-12-19 NOTE — ED Provider Notes (Signed)
Medical screening examination/treatment/procedure(s) were performed by non-physician practitioner and as supervising physician I was immediately available for consultation/collaboration.   EKG Interpretation None       Lashara Urey K Jeovany Huitron-Rasch, MD 12/19/13 0449 

## 2013-12-19 NOTE — ED Notes (Signed)
Bed: WA06 Expected date:  Expected time:  Means of arrival:  Comments: 

## 2013-12-23 ENCOUNTER — Emergency Department (HOSPITAL_COMMUNITY)
Admission: EM | Admit: 2013-12-23 | Discharge: 2013-12-23 | Disposition: A | Discharge status: Home / Self Care | Payer: No Typology Code available for payment source | Attending: Emergency Medicine | Admitting: Emergency Medicine

## 2013-12-23 ENCOUNTER — Encounter (HOSPITAL_COMMUNITY): Payer: Self-pay | Admitting: Emergency Medicine

## 2013-12-23 DIAGNOSIS — K047 Periapical abscess without sinus: Secondary | ICD-10-CM | POA: Diagnosis not present

## 2013-12-23 DIAGNOSIS — F17200 Nicotine dependence, unspecified, uncomplicated: Secondary | ICD-10-CM | POA: Diagnosis not present

## 2013-12-23 DIAGNOSIS — K089 Disorder of teeth and supporting structures, unspecified: Secondary | ICD-10-CM | POA: Diagnosis present

## 2013-12-23 DIAGNOSIS — Z87828 Personal history of other (healed) physical injury and trauma: Secondary | ICD-10-CM | POA: Insufficient documentation

## 2013-12-23 DIAGNOSIS — K029 Dental caries, unspecified: Secondary | ICD-10-CM | POA: Diagnosis not present

## 2013-12-23 MED ORDER — OXYCODONE-ACETAMINOPHEN 5-325 MG PO TABS
1.0000 | ORAL_TABLET | Freq: Once | ORAL | Status: AC
  Administered 2013-12-23: 1 via ORAL
  Filled 2013-12-23: qty 1

## 2013-12-23 MED ORDER — HYDROCODONE-ACETAMINOPHEN 5-325 MG PO TABS
1.0000 | ORAL_TABLET | ORAL | Status: DC | PRN
Start: 2013-12-23 — End: 2016-05-06

## 2013-12-23 NOTE — ED Notes (Signed)
Pt reports pain on the left bottom side of her mouth beginning last Thursday. Pt reports a decrease in the swelling since then.

## 2013-12-23 NOTE — ED Provider Notes (Signed)
CSN: [id number]     Arrival date & time 12/23/13  1118 History   First MD Initiated Contact with Patient 12/23/13 1126     Chief Complaint  Patient presents with  . Dental Pain   (Consider location/radiation/quality/duration/timing/severity/associated sxs/prior Treatment) HPI Danielle Hayden is a 38 yo female presenting with left tooth pain onset Thursday.  She reports she was eating and the tooth broke.  She reports immediate pain that has worsened since then.  She describes the pain as constant, sharp and aching, and rates the pain as 10/10.  She was seen Thursday and discharged with pain meds and abx. She has not been able to see a dentist since then because of financial issues.  She denies fevers, chills, nausea or vomiting.    Past Medical History  Diagnosis Date  . H/O malignant hyperthermia    Past Surgical History  Procedure Laterality Date  . Ventriculoperitoneal shunt     No family history on file. History  Substance Use Topics  . Smoking status: Current Every Day Smoker -- 0.50 packs/day for 15 years    Types: Cigarettes  . Smokeless tobacco: Not on file  . Alcohol Use: Yes     Comment: occasionally   OB History   Grav Para Term Preterm Abortions TAB SAB Ect Mult Living                 Review of Systems  Constitutional: Negative for fever and chills.  HENT: Positive for dental problem. Negative for trouble swallowing.   Respiratory: Negative for shortness of breath.   Gastrointestinal: Negative for nausea.  Skin: Negative for rash.    Allergies  Review of patient's allergies indicates no known allergies.  Home Medications   Prior to Admission medications   Medication Sig Start Date End Date Taking? Authorizing Provider  diazepam (VALIUM) 5 MG tablet Take 1 tablet (5 mg total) by mouth every 6 (six) hours as needed for anxiety (spasms). 05/21/12   Nicole Pisciotta, PA-C  ibuprofen (ADVIL,MOTRIN) 200 MG tablet Take 400 mg by mouth every 6 (six) hours as needed  for pain (as needed for pain).    Historical Provider, MD   BP 153/90  Pulse 92  Temp(Src) 97.3 F (36.3 C) (Oral)  Resp 16  SpO2 100% Physical Exam  Nursing note and vitals reviewed. Constitutional: She is oriented to person, place, and time. She appears well-developed and well-nourished. No distress.  HENT:  Head: Atraumatic.    Mouth/Throat: Oropharynx is clear and moist. Dental abscesses and dental caries present. No uvula swelling. No oropharyngeal exudate.    Lymphadenopathy:    She has no cervical adenopathy.  Neurological: She is alert and oriented to person, place, and time.  Skin: Skin is warm and dry. She is not diaphoretic.  Psychiatric: She has a normal mood and affect.    ED Course  Procedures (including critical care time) Labs Review Labs Reviewed - No data to display  Imaging Review No results found.   EKG Interpretation None      MDM   Final diagnoses:  Dental abscess   Patient with toothache and dental abscess, recently evaluated for the same.  Still taking abx.  Exam unconcerning for Ludwig's angina or systemic infection.  Discharge instructions include prescription for pain meds and referral for dentist with strict instructions to follow-up.  Pt agreeable with plan.  Return precautions provided.   Filed Vitals:   12/23/13 1142  BP: 153/90  Pulse: 92  Temp: 97.3  F (36.3 C)  TempSrc: Oral  Resp: 16  SpO2: 100%    Meds given in ED:  Medications  oxyCODONE-acetaminophen (PERCOCET/ROXICET) 5-325 MG per tablet 1 tablet (1 tablet Oral Given 12/23/13 1204)    Discharge Medication List as of 12/23/2013 12:10 PM    START taking these medications   Details  HYDROcodone-acetaminophen (NORCO/VICODIN) 5-325 MG per tablet Take 1-2 tablets by mouth every 4 (four) hours as needed for moderate pain or severe pain., Starting 12/23/2013, Until Discontinued, Print         Harle Battiest, NP 12/24/13 1253

## 2013-12-23 NOTE — Discharge Instructions (Signed)
Please follow the directions provided.  Be sure to follow-up with the dentist referral for treatment of this tooth abscess.    SEEK IMMEDIATE MEDICAL CARE IF:  You have a fever or persistent symptoms for more than 2-3 days.  You have a fever and your symptoms suddenly get worse.  You have chills or a very bad headache.  You have problems breathing or swallowing.  You have trouble opening your mouth.  You have swelling in the neck or around the eye.

## 2013-12-24 NOTE — ED Provider Notes (Signed)
Medical screening examination/treatment/procedure(s) were performed by non-physician practitioner and as supervising physician I was immediately available for consultation/collaboration.   EKG Interpretation None       Doug Sou, MD 12/24/13 1624

## 2013-12-27 ENCOUNTER — Emergency Department (HOSPITAL_COMMUNITY)
Admission: EM | Admit: 2013-12-27 | Discharge: 2013-12-27 | Disposition: A | Discharge status: Home / Self Care | Payer: No Typology Code available for payment source | Attending: Emergency Medicine | Admitting: Emergency Medicine

## 2013-12-27 ENCOUNTER — Encounter (HOSPITAL_COMMUNITY): Payer: Self-pay | Admitting: Emergency Medicine

## 2013-12-27 DIAGNOSIS — R197 Diarrhea, unspecified: Secondary | ICD-10-CM | POA: Diagnosis not present

## 2013-12-27 DIAGNOSIS — F17200 Nicotine dependence, unspecified, uncomplicated: Secondary | ICD-10-CM | POA: Diagnosis not present

## 2013-12-27 DIAGNOSIS — E876 Hypokalemia: Secondary | ICD-10-CM | POA: Diagnosis not present

## 2013-12-27 DIAGNOSIS — R112 Nausea with vomiting, unspecified: Secondary | ICD-10-CM | POA: Insufficient documentation

## 2013-12-27 DIAGNOSIS — Z79899 Other long term (current) drug therapy: Secondary | ICD-10-CM | POA: Diagnosis not present

## 2013-12-27 DIAGNOSIS — R1013 Epigastric pain: Secondary | ICD-10-CM | POA: Insufficient documentation

## 2013-12-27 DIAGNOSIS — Z3202 Encounter for pregnancy test, result negative: Secondary | ICD-10-CM | POA: Diagnosis not present

## 2013-12-27 LAB — BASIC METABOLIC PANEL
ANION GAP: 17 — AB (ref 5–15)
BUN: 13 mg/dL (ref 6–23)
CO2: 21 mEq/L (ref 19–32)
Calcium: 9.2 mg/dL (ref 8.4–10.5)
Chloride: 99 mEq/L (ref 96–112)
Creatinine, Ser: 0.6 mg/dL (ref 0.50–1.10)
GFR calc Af Amer: >90 mL/min (ref >90)
Glucose, Bld: 116 mg/dL — ABNORMAL HIGH (ref 70–99)
Potassium: 3 mEq/L — ABNORMAL LOW (ref 3.7–5.3)
SODIUM: 137 meq/L (ref 137–147)

## 2013-12-27 LAB — HEPATIC FUNCTION PANEL
ALT: 25 U/L (ref 0–35)
AST: 18 U/L (ref 0–37)
Albumin: 4.1 g/dL (ref 3.5–5.2)
Alkaline Phosphatase: 57 U/L (ref 39–117)
Bilirubin, Direct: <0.2 mg/dL (ref 0.0–0.3)
TOTAL PROTEIN: 7.9 g/dL (ref 6.0–8.3)
Total Bilirubin: 0.7 mg/dL (ref 0.3–1.2)

## 2013-12-27 LAB — CBC WITH DIFFERENTIAL/PLATELET
BASOS ABS: 0.1 10*3/uL (ref 0.0–0.1)
Basophils Relative: 1 % (ref 0–1)
EOS ABS: 0 10*3/uL (ref 0.0–0.7)
Eosinophils Relative: 0 % (ref 0–5)
HCT: 43.6 % (ref 36.0–46.0)
Hemoglobin: 15.5 g/dL — ABNORMAL HIGH (ref 12.0–15.0)
LYMPHS PCT: 26 % (ref 12–46)
Lymphs Abs: 2.6 10*3/uL (ref 0.7–4.0)
MCH: 31.3 pg (ref 26.0–34.0)
MCHC: 35.6 g/dL (ref 30.0–36.0)
MCV: 88.1 fL (ref 78.0–100.0)
Monocytes Absolute: 0.8 10*3/uL (ref 0.1–1.0)
Monocytes Relative: 8 % (ref 3–12)
NEUTROS PCT: 65 % (ref 43–77)
Neutro Abs: 6.6 10*3/uL (ref 1.7–7.7)
PLATELETS: 483 10*3/uL — AB (ref 150–400)
RBC: 4.95 MIL/uL (ref 3.87–5.11)
RDW: 12.7 % (ref 11.5–15.5)
WBC: 10 10*3/uL (ref 4.0–10.5)

## 2013-12-27 LAB — URINALYSIS, ROUTINE W REFLEX MICROSCOPIC
Bilirubin Urine: NEGATIVE
Glucose, UA: NEGATIVE mg/dL
HGB URINE DIPSTICK: NEGATIVE
Ketones, ur: NEGATIVE mg/dL
LEUKOCYTES UA: NEGATIVE
Nitrite: NEGATIVE
PROTEIN: NEGATIVE mg/dL
Specific Gravity, Urine: 1.007 (ref 1.005–1.030)
UROBILINOGEN UA: 0.2 mg/dL (ref 0.0–1.0)
pH: 8 (ref 5.0–8.0)

## 2013-12-27 LAB — PREGNANCY, URINE: PREG TEST UR: NEGATIVE

## 2013-12-27 LAB — LIPASE, BLOOD: LIPASE: 35 U/L (ref 11–59)

## 2013-12-27 MED ORDER — SODIUM CHLORIDE 0.9 % IV SOLN
1000.0000 mL | Freq: Once | INTRAVENOUS | Status: AC
  Administered 2013-12-27: 1000 mL via INTRAVENOUS

## 2013-12-27 MED ORDER — PROMETHAZINE HCL 25 MG PO TABS: 25.0000 mg | ORAL_TABLET | Freq: Four times a day (QID) | ORAL | Status: DC | PRN

## 2013-12-27 MED ORDER — DICYCLOMINE HCL 10 MG PO CAPS
10.0000 mg | ORAL_CAPSULE | Freq: Once | ORAL | Status: AC
  Administered 2013-12-27: 10 mg via ORAL
  Filled 2013-12-27: qty 1

## 2013-12-27 MED ORDER — SODIUM CHLORIDE 0.9 % IV SOLN: 1000.0000 mL | INTRAVENOUS | Status: DC

## 2013-12-27 MED ORDER — FAMOTIDINE IN NACL 20-0.9 MG/50ML-% IV SOLN
20.0000 mg | Freq: Once | INTRAVENOUS | Status: AC
  Administered 2013-12-27: 20 mg via INTRAVENOUS
  Filled 2013-12-27: qty 50

## 2013-12-27 MED ORDER — HYDROCODONE-ACETAMINOPHEN 5-325 MG PO TABS
1.0000 | ORAL_TABLET | Freq: Once | ORAL | Status: AC
  Administered 2013-12-27: 1 via ORAL
  Filled 2013-12-27: qty 1

## 2013-12-27 MED ORDER — MORPHINE SULFATE 4 MG/ML IJ SOLN
4.0000 mg | INTRAMUSCULAR | Status: DC | PRN
  Filled 2013-12-27: qty 1

## 2013-12-27 MED ORDER — SODIUM CHLORIDE 0.9 % IV BOLUS (SEPSIS): 1000.0000 mL | Freq: Once | INTRAVENOUS | Status: DC

## 2013-12-27 MED ORDER — SODIUM CHLORIDE 0.9 % IV BOLUS (SEPSIS)
1000.0000 mL | Freq: Once | INTRAVENOUS | Status: AC
  Administered 2013-12-27: 1000 mL via INTRAVENOUS

## 2013-12-27 MED ORDER — POTASSIUM CHLORIDE CRYS ER 20 MEQ PO TBCR
40.0000 meq | EXTENDED_RELEASE_TABLET | Freq: Once | ORAL | Status: AC
  Administered 2013-12-27: 40 meq via ORAL
  Filled 2013-12-27: qty 2

## 2013-12-27 MED ORDER — ONDANSETRON HCL 4 MG/2ML IJ SOLN
4.0000 mg | Freq: Once | INTRAMUSCULAR | Status: AC
  Administered 2013-12-27: 4 mg via INTRAVENOUS
  Filled 2013-12-27: qty 2

## 2013-12-27 NOTE — ED Provider Notes (Signed)
History/physical exam/procedure(s) were performed by non-physician practitioner and as supervising physician I was immediately available for consultation/collaboration. I have reviewed all notes and am in agreement with care and plan.   Hilario Quarry, MD 12/27/13 [phone]

## 2013-12-27 NOTE — ED Notes (Signed)
Pt tolerating po fluids

## 2013-12-27 NOTE — ED Provider Notes (Signed)
CSN: [id number]     Arrival date & time 12/27/13  1115 History   First MD Initiated Contact with Patient 12/27/13 1126     Chief Complaint  Patient presents with  . Abdominal Pain  . Emesis  . Diarrhea     (Consider location/radiation/quality/duration/timing/severity/associated sxs/prior Treatment) HPI  Danielle Hayden is a 38 y.o. female complaining of nonbloody, nonbilious, non-coffee ground emesis starting onset 3 days ago. Patient approximately 7-10 episodes of bilious emesis yesterday, denies blood or coffee-ground appearance, states she also had 3 episodes of loose stool. States that she has 10 out of 10 epigastric pain, states she has not eaten food in 4 days. Patient denies fever but endorses chills. Denies sick contacts, abnormal vaginal discharge, decreased urinary output, dysuria, hematuria, chest pain, shortness of breath, recent travel or camping.   Past Medical History  Diagnosis Date  . Malignant hyperthermia    History reviewed. No pertinent past surgical history. History reviewed. No pertinent family history. History  Substance Use Topics  . Smoking status: Current Every Day Smoker -- 0.50 packs/day    Types: Cigarettes  . Smokeless tobacco: Not on file  . Alcohol Use: Yes   OB History   Grav Para Term Preterm Abortions TAB SAB Ect Mult Living                 Review of Systems  10 systems reviewed and found to be negative, except as noted in the HPI.   Allergies  Review of patient's allergies indicates no known allergies.  Home Medications   Prior to Admission medications   Medication Sig Start Date End Date Taking? Authorizing Provider  HYDROcodone-acetaminophen (NORCO/VICODIN) 5-325 MG per tablet Take 1-2 tablets by mouth every 6 (six) hours as needed for moderate pain.   Yes Historical Provider, MD  ibuprofen (ADVIL,MOTRIN) 200 MG tablet Take 600 mg by mouth every 6 (six) hours as needed for moderate pain.    Yes Historical Provider, MD  penicillin v  potassium (VEETID) 500 MG tablet Take 500 mg by mouth 4 (four) times daily.   Yes Historical Provider, MD  promethazine (PHENERGAN) 25 MG tablet Take 1 tablet (25 mg total) by mouth every 6 (six) hours as needed for nausea or vomiting. 12/27/13   Joni Reining Rebecah Dangerfield, PA-C   BP 144/81  Pulse 58  Temp(Src) 98.3 F (36.8 C) (Oral)  Resp 14  SpO2 100% Physical Exam  Nursing note and vitals reviewed. Constitutional: She is oriented to person, place, and time. She appears well-developed and well-nourished. No distress.  Awake and alert, nontoxic appearing  HENT:  Head: Normocephalic and atraumatic.  Mouth/Throat: Oropharynx is clear and moist.  Eyes: Conjunctivae and EOM are normal. Pupils are equal, round, and reactive to light.  Neck: Normal range of motion.  Cardiovascular: Normal rate, regular rhythm and intact distal pulses.   Pulmonary/Chest: Effort normal and breath sounds normal. No stridor. No respiratory distress. She has no wheezes. She has no rales. She exhibits no tenderness.  Abdominal: Soft. Bowel sounds are normal. She exhibits no distension and no mass. There is tenderness. There is no rebound and no guarding.  Normoactive bowel sounds, nontender this to deep palpation of the epigastrium, no guarding or rebound, no focal tenderness in the right upper or right lower quadrant.  Musculoskeletal: Normal range of motion. She exhibits no edema and no tenderness.  Neurological: She is alert and oriented to person, place, and time.  Skin: Skin is warm.  Psychiatric: She has a normal mood  and affect.    ED Course  Procedures (including critical care time) Labs Review Labs Reviewed  CBC WITH DIFFERENTIAL - Abnormal; Notable for the following:    Hemoglobin 15.5 (*)    Platelets 483 (*)    All other components within normal limits  BASIC METABOLIC PANEL - Abnormal; Notable for the following:    Potassium 3.0 (*)    Glucose, Bld 116 (*)    Anion gap 17 (*)    All other components  within normal limits  HEPATIC FUNCTION PANEL  LIPASE, BLOOD  PREGNANCY, URINE  URINALYSIS, ROUTINE W REFLEX MICROSCOPIC    Imaging Review No results found.   EKG Interpretation None      MDM   Final diagnoses:  Nausea vomiting and diarrhea    Filed Vitals:   12/27/13 1127 12/27/13 1347  BP: 161/89 144/81  Pulse: 63 58  Temp: 97.7 F (36.5 C) 98.3 F (36.8 C)  TempSrc: Oral Oral  Resp: 16 14  SpO2: 100% 100%    Medications  0.9 %  sodium chloride infusion (0 mLs Intravenous Stopped 12/27/13 1234)    Followed by  0.9 %  sodium chloride infusion (not administered)  morphine 4 MG/ML injection 4 mg (not administered)  sodium chloride 0.9 % bolus 1,000 mL (not administered)  ondansetron (ZOFRAN) injection 4 mg (4 mg Intravenous Given 12/27/13 1146)  famotidine (PEPCID) IVPB 20 mg (0 mg Intravenous Stopped 12/27/13 1234)  sodium chloride 0.9 % bolus 1,000 mL (0 mLs Intravenous Stopped 12/27/13 1349)  potassium chloride SA (K-DUR,KLOR-CON) CR tablet 40 mEq (40 mEq Oral Given 12/27/13 1350)  ondansetron (ZOFRAN) injection 4 mg (4 mg Intravenous Given 12/27/13 1350)  dicyclomine (BENTYL) capsule 10 mg (10 mg Oral Given 12/27/13 1350)  HYDROcodone-acetaminophen (NORCO/VICODIN) 5-325 MG per tablet 1 tablet (1 tablet Oral Given 12/27/13 1350)    Danielle Hayden is a 38 y.o. female presenting with nausea vomiting diarrhea, epigastric abdominal pain and chills. Abdominal exam is nonsurgical, there is mild tenderness palpation in the epigastrium.  Blood work shows a hypokalemia of 3.0, will attempt repletion via by mouth. She declines morphine because she is afraid it will make her nauseous. I will give her Bentyl and Vicodin by mouth. She has a mild anion gap.   Serial abdominal exams remain benign. Patient seen and examined at the bedside at 1:50 PM. Resting comfortably, she declines more IV fluids. We'll by mouth challenge with Bentyl, Vicodin and potassium.  Evaluation does not show  pathology that would require ongoing emergent intervention or inpatient treatment. Pt is hemodynamically stable and mentating appropriately. Discussed findings and plan with patient/guardian, who agrees with care plan. All questions answered. Return precautions discussed and outpatient follow up given.   Discharge Medication List as of 12/27/2013  2:33 PM    START taking these medications   Details  promethazine (PHENERGAN) 25 MG tablet Take 1 tablet (25 mg total) by mouth every 6 (six) hours as needed for nausea or vomiting., Starting 12/27/2013, Until Discontinued, [employer], PA-C 12/27/13 1502

## 2013-12-27 NOTE — Progress Notes (Signed)
  CARE MANAGEMENT ED NOTE 12/27/2013  Patient:  [facility]   Account Number:  [id number]  Date Initiated:  12/27/2013  Documentation initiated by:  Edd Arbour  Subjective/Objective Assessment:   38 yr old [employer] first health covered pt c/o upper abdominal pain, n/v x 4 days after taken percocet for pain from tooth removal 3 days ago     Subjective/Objective Assessment Detail:   pt noted to be shivering during cm assessment Cm offered extra blanket but refused by pt  Pt confirms no pcp at this time  Pt nodded understanding of how to obtain a pcp for f/u care as discussed by Cm     Action/Plan:   Noted no pcp spoke with pt see notes below   Action/Plan Detail:   Anticipated DC Date:  12/27/2013     Status Recommendation to Physician:   Result of Recommendation:    Other ED Services  Consult Working Plan    DC Planning Services  Other  Outpatient Services - Pt will follow up  PCP issues    Choice offered to / List presented to:            Status of service:  Completed, signed off  ED Comments:   ED Comments Detail:  [facility] ED CM spoke with pt on how to obtain an in network pcp with insurance coverage via the customer service number or web site Cm reviewed ED level of care for crisis/emergent services and community pcp level of care to manage continuous or chronic medical concerns.  The pt voiced understanding CM encouraged pt and discussed pt's responsibility to verify with pt's insurance carrier that any recommended medical provider offered by any emergency room or a hospital provider is within the carrier's network. The pt voiced understanding   Follow-up With Details Comments Contact Info [employer] customer service or toll free number  Call or go to the [email or web address] site to assist with finding a primary care provider to follow up with  COVENTRY/COVENTRY [facility] [address] [place], [place] [address] [phone]

## 2013-12-27 NOTE — ED Notes (Signed)
Pt c/o upper abdominal pain and n/v/d x 4 days.  Pain score 10/10.  Pt reports "I was here on Monday for tooth pain and they gave me a Percocet that made me throw up.  I've been hurting since then."  Pt had tooth removed x 3 days ago.

## 2013-12-27 NOTE — Discharge Instructions (Signed)
Return to the emergency room for severely worsening abdominal pain, abdominal pain that localizes to a particular area (especially the right lower part of the belly), pain that persists past 8-10 hours, blood in stool or vomit, severe weakness, fainting, or fever.   Maintain hydration by drinking small amounts of clear fluids frequently, then soft diet, and then advance to a solid diet as tolerated. Avoid foods that are spicy, high in fat or dairy.  Push fluids: take small frequent sips of water or Gatorade, do not drink any soda, juice or caffeinated beverages.    Slowly resume solid diet as desired. Avoid food that are spicy, contain dairy and/or have high fat content.  Aviod NSAIDs (aspirin, motrin, ibuprofen, naproxen, Aleve et Karie Soda) for pain control because they will irritate your stomach.  Do not hesitate to return to the emergency room for any new, worsening or concerning symptoms.  Please obtain primary care using resource guide below. But the minute you were seen in the emergency room and that they will need to obtain records for further outpatient management.     Emergency Department Resource Guide 1) Find a Doctor and Pay Out of Pocket Although you won't have to find out who is covered by your insurance plan, it is a good idea to ask around and get recommendations. You will then need to call the office and see if the doctor you have chosen will accept you as a new patient and what types of options they offer for patients who are self-pay. Some doctors offer discounts or will set up payment plans for their patients who do not have insurance, but you will need to ask so you aren't surprised when you get to your appointment.  2) Contact Your Local Health Department Not all health departments have doctors that can see patients for sick visits, but many do, so it is worth a call to see if yours does. If you don't know where your local health department is, you can check in your phone  book. The CDC also has a tool to help you locate your state's health department, and many state websites also have listings of all of their local health departments.  3) Find a Walk-in Clinic If your illness is not likely to be very severe or complicated, you may want to try a walk in clinic. These are popping up all over the country in pharmacies, drugstores, and shopping centers. They're usually staffed by nurse practitioners or physician assistants that have been trained to treat common illnesses and complaints. They're usually fairly quick and inexpensive. However, if you have serious medical issues or chronic medical problems, these are probably not your best option.  No Primary Care Doctor: - Call Health Connect at  [phone] - they can help you locate a primary care doctor that  accepts your insurance, provides certain services, etc. - Physician Referral Service- [phone]  Chronic Pain Problems: Organization         Address  Phone   Notes  [facility]  [phone] Patients need to be referred by their primary care doctor.   Medication Assistance: Organization         Address  Phone   Notes  [facility] Medication [facility] [address] [place] [place], [place] [address] [phone] --Must be a resident of [facility] -- Must have NO insurance coverage whatsoever (no Medicaid/ Medicare, etc.) -- The pt. MUST have a primary care doctor that directs their care  regularly and follows them in the community   Med[facility]ist  [phone]   [employer]  [phone]    Agencies that provide inexpensive medical care: Organization         Address  Phone   Notes  [facility]  [phone]   [facility]    [phone]   [facility] [address] [place], [place] [address] [phone]   [facility] [address], [place] [phone]   Planned  Parenthood    [phone]   [facility]    [phone]   [facility] and [facility]  [address], [place] Phone:  [phone], Fax:  [phone] Hours of Operation:  9 am - 6 pm, M-F.  Also accepts Medicaid/Medicare and self-pay.  [facility]  [address], [place] Phone: [phone], Fax: [phone]. Hours of Operation:  8:30 am - 5:30 pm, M-F.  Also accepts Medicaid and self-pay.  [facility] [place] [address] [place] Phone: [phone]   [facility] [address] Natasha Bence [place], [place] [phone], Ext. [phone] Mondays & Thursdays: 7-9 AM.  First 15 patients are seen on a first come, first serve basis.    Medicaid-accepting [facility] Providers:  Organization         Address  Phone   Notes  [facility] [address], [place] [phone] Also accepts self-pay patients.  [employer] [facility] [address] Laurell Josephs [place], [place]  [phone]   [facility] [address], [place] [phone]   [facility] [address] [place] [phone]   Renaye Rakers [address], [place]   [phone] Only accepts [place] Access [place] patients after they have their name applied to their card.   Self-Pay (no insurance) in [facility]:  Organization         Address  Phone   Notes  Sickle Cell Patients, [facility] [place] [facility] Internal Medicine [address] [place], [place] [phone]   [facility] [address] [place], [place] [phone]   [facility] [place]  [address], [place] [phone]   [facility]/Dr. Osei-Bonsu  [address] [place] or [address], [place] [phone] Phone number for both [place] and [place] locations is the same.    [facility] and [facility] [address], [place] [phone]   [facility] [address], [place] or [address] [phone]   [facility] [address], [place] [phone], phone; [phone], fax Sees patients 1st and 3rd Saturday of every month.  Must not qualify for public or private insurance (i.e. Medicaid, Medicare, [place] Health Choice, Veterans' Benefits)  Household income should be no more than 200% of the poverty level The clinic cannot treat you if you are pregnant or think you are pregnant  Sexually transmitted diseases are not treated at the clinic.    Dental Care: Organization         Address  Phone  Notes  [facility] [address] [place], [place] [phone] Accepts children up to age  21 who are enrolled in Medicaid or [place] Health Choice; pregnant women with a Medicaid card; and children who have applied for Medicaid or [place] Health Choice, but were declined, whose parents can pay a reduced fee at time of service.  [facility] Department of [facility]  [address], [place] [phone] Accepts children up to age 32 who are enrolled in [place] or [place] Health Choice; pregnant women with a Medicaid card; and children who have applied for Medicaid or [place] Health Choice, but were declined, whose parents can pay a reduced fee at time of service.  [facility]  [address] [place], [place] [phone] Patients are seen by appointment only. Walk-ins are not accepted. [facility] will see patients 8 years of age and older. Monday - Tuesday (8am-5pm) Most Wednesdays (8:30-5pm) $30 per visit, cash only  [facility] Adult Dental Access PRO[facility]  [address], [facility] [phone] Patients are seen by appointment only. Walk-ins are not accepted. [facility] will see patients 60  years of age and older. One Wednesday Evening (Monthly: Volunteer Based).  $30 per visit, cash only  [employer]  [phone] for adults; Children under age 43, call Graduate Pediatric Dentistry at [phone]. Children aged 17-14, please call [phone] to request a pediatric application.  Dental services are provided in all areas of dental care including fillings, crowns and bridges, complete and partial dentures, implants, gum treatment, root canals, and extractions. Preventive care is also provided. Treatment is provided to both adults and children. Patients are selected via a lottery and there is often a waiting list.   [facility] [address], [place]  [phone] [email or web address]   [facility] [address] [place], [place] [phone], Ext. [phone] Second and Fourth Thursday of each month, opens at 6:30 AM; Clinic ends at 9 AM.  Patients are seen on a first-come first-served basis, and a limited number are seen during each clinic.   [facility]  [address] Ether [place]ins [place], [place] [phone]   Eligibility Requirements You must have lived in [place], [place], or [place] counties for at least the last three months.   You cannot be eligible for state or federal sponsored [place], including [employer], [place], or [employer].   You generally cannot be eligible for healthcare insurance through your employer.    How to apply: Eligibility screenings are held every Tuesday and Wednesday afternoon from 1:00 pm until 4:00 pm. You do not need an appointment for the interview!  [facility] [address], [place], [place] [phone]   [facility] Health Department  [phone]   [facility] Health Department  [phone]   [facility] Health Department  [phone]    Behavioral Health Resources in the Community: Intensive Outpatient  Programs Organization         Address  Phone  Notes  [facility] [address] [place], [place] [phone]   [facility] Outpatient [address], [place], [place] [phone]   [facility] [address], [place], [place]  [phone]   [facility] Mental Health [address]  [place], [place] [phone] or [phone]   Substance Abuse Resources Organization         Address  Phone  Notes  [facility] Drug Services  [phone]   [facility]  [phone]   [facility]  [phone]   Floydene Flock  [phone]   Residential & Outpatient Substance Abuse Program  [phone]   Psychological Services Organization         Address  Phone  Notes  [facility] Behavioral Health  [phone]   [facility]  [phone]   [facility] Mental Health [address] [place] [phone] or [phone]    Mobile Crisis Teams Organization         Address  Phone  Notes  [facility], Mobile Crisis Care Unit  [phone]   [facility]  [address] [place], [place] [phone]   Doristine Locks [address] [place] [phone]    Self-Help/Support Groups Organization         Address  Phone             Notes  [facility] [place] - variety of support groups  336- I7437963 Call for more information  Narcotics Anonymous (NA), [facility] [address], [employer] [place]  2 meetings at this location   Statistician         Address  Phone  Notes  [facility] [address],    [place]  [phone]   [facility]  [address], [place] [address], [place], [place] [phone]   [facility] [address] [place], [place] [phone] Admissions: 8am-3pm M-F  [facility] [address]    [place], [place]  [phone]   [facility] [address] [place], [place], [place] [phone]   [facility] [address]  [place], [place] [phone]   [facility] - Intensive Outpatient [address] Laurell Josephs [address], [place], [place] [phone]   [facility] ([facility] [address] [place]  [place], [place] [phone] or [phone]   [facility] ([facility]) [address] [place], [place] [phone] Accepts Medicaid  [facility] [address],  [place] [phone] Substance Abuse/Addiction Treatment   [facility] Organization         Address  Phone  Notes  [facility]  [phone]   Angie Fava, PhD [address] Ervin Knack [place], [place]   [phone] or [phone]   [facility] Behavioral   [address] [place], [place] [phone]   [facility] [address] [place], [place] [phone] Insurance/Medicaid/sponsorship through [facility] and Families [address]                                    [place], [place] [phone] Therapy/tele-psych/case  [facility] [address], [place] [phone]    Dr. Lolly Mustache  [phone]   [facility]  [employer] [facility] Dept. 1) [address], [place] 2) [address] [place] 3)  [address], [place] [phone]  [phone]   [facility] Child Abuse Hotline 781-505-3092 or 2312045666 (After Hours)

## 2013-12-30 ENCOUNTER — Encounter (HOSPITAL_COMMUNITY): Payer: Self-pay | Admitting: Emergency Medicine

## 2014-03-17 ENCOUNTER — Emergency Department (HOSPITAL_COMMUNITY)
Admission: EM | Admit: 2014-03-17 | Discharge: 2014-03-17 | Disposition: A | Discharge status: Home / Self Care | Payer: No Typology Code available for payment source | Attending: Emergency Medicine | Admitting: Emergency Medicine

## 2014-03-17 ENCOUNTER — Encounter (HOSPITAL_COMMUNITY): Payer: Self-pay | Admitting: Emergency Medicine

## 2014-03-17 DIAGNOSIS — R109 Unspecified abdominal pain: Secondary | ICD-10-CM | POA: Diagnosis present

## 2014-03-17 DIAGNOSIS — Z87828 Personal history of other (healed) physical injury and trauma: Secondary | ICD-10-CM | POA: Diagnosis not present

## 2014-03-17 DIAGNOSIS — Z720 Tobacco use: Secondary | ICD-10-CM | POA: Diagnosis not present

## 2014-03-17 DIAGNOSIS — K429 Umbilical hernia without obstruction or gangrene: Secondary | ICD-10-CM | POA: Diagnosis not present

## 2014-03-17 MED ORDER — ONDANSETRON HCL 4 MG/2ML IJ SOLN
4.0000 mg | Freq: Once | INTRAMUSCULAR | Status: AC
  Administered 2014-03-17: 4 mg via INTRAVENOUS
  Filled 2014-03-17: qty 2

## 2014-03-17 MED ORDER — HYDROMORPHONE HCL 1 MG/ML IJ SOLN
1.0000 mg | Freq: Once | INTRAMUSCULAR | Status: AC
  Administered 2014-03-17: 1 mg via INTRAVENOUS
  Filled 2014-03-17: qty 1

## 2014-03-17 NOTE — Discharge Instructions (Signed)
Hernia A hernia happens when an organ inside your body pushes out through a weak spot in your belly (abdominal) wall. Most hernias get worse over time. They can often be pushed back into place (reduced). Surgery may be needed to repair hernias that cannot be pushed into place. HOME CARE  Keep doing normal activities.  Avoid lifting more than 10 pounds (4.5 kilograms).  Cough gently and avoid straining. Over time, these things will:  Increase your hernia size.  Irritate your hernia.  Break down hernia repairs.  Stop smoking.  Do not wear anything tight over your hernia. Do not keep the hernia in with an outside bandage.  Eat food that is high in fiber (fruit, vegetables, whole grains).  Drink enough fluids to keep your pee (urine) clear or pale yellow.  Take medicines to make your poop soft (stool softeners) if you cannot poop (constipated). GET HELP RIGHT AWAY IF:   You have a fever.  You have belly pain that gets worse.  You feel sick to your stomach (nauseous) and throw up (vomit).  Your skin starts to bulge out.  Your hernia turns a different color, feels hard, or is tender.  You have increased pain or puffiness (swelling) around the hernia.  You poop more or less often.  Your poop does not look the way normally does.  You have watery poop (diarrhea).  You cannot push the hernia back in place by applying gentle pressure while lying down. MAKE SURE YOU:   Understand these instructions.  Will watch your condition.  Will get help right away if you are not doing well or get worse. Document Released: 09/08/2009 Document Revised: 06/13/2011 Document Reviewed: 09/08/2009 [facility] Patient Information 2015 [employer], [place]. This information is not intended to replace advice given to you by your health care provider. Make sure you discuss any questions you have with your health care provider.  Emergency Department Resource Guide 1) Find a Doctor and Pay Out of  Pocket Although you won't have to find out who is covered by your insurance plan, it is a good idea to ask around and get recommendations. You will then need to call the office and see if the doctor you have chosen will accept you as a new patient and what types of options they offer for patients who are self-pay. Some doctors offer discounts or will set up payment plans for their patients who do not have insurance, but you will need to ask so you aren't surprised when you get to your appointment.  2) Contact Your Local Health Department Not all health departments have doctors that can see patients for sick visits, but many do, so it is worth a call to see if yours does. If you don't know where your local health department is, you can check in your phone book. The CDC also has a tool to help you locate your state's health department, and many state websites also have listings of all of their local health departments.  3) Find a Walk-in Clinic If your illness is not likely to be very severe or complicated, you may want to try a walk in clinic. These are popping up all over the country in pharmacies, drugstores, and shopping centers. They're usually staffed by nurse practitioners or physician assistants that have been trained to treat common illnesses and complaints. They're usually fairly quick and inexpensive. However, if you have serious medical issues or chronic medical problems, these are probably not your best option.  No Primary Care  Doctor: - Call Health Connect at  [phone] - they can help you locate a primary care doctor that  accepts your insurance, provides certain services, etc. - Physician Referral Service- [phone]  Chronic Pain Problems: Organization         Address  Phone   Notes  [facility]  [phone] Patients need to be referred by their primary care doctor.   Medication Assistance: Organization         Address  Phone   Notes  [facility]  Medication [facility] [address] [place], [place] [address] [phone] --Must be a resident of [facility] -- Must have NO insurance coverage whatsoever (no Medicaid/ Medicare, etc.) -- The pt. MUST have a primary care doctor that directs their care regularly and follows them in the community   [facility]ist  [phone]   [employer]  [phone]    Agencies that provide inexpensive medical care: Organization         Address  Phone   Notes  [facility]  [phone]   [facility]    [phone]   [facility] [address] [place], [place] [address] [phone]   [facility] [address] [place], [place] [phone]   Planned Parenthood    [phone]   [facility]    [phone]   [facility] and [facility]  [address], [place] Phone:  [phone], Fax:  [phone] Hours of Operation:  9 am - 6 pm, M-F.  Also accepts Medicaid/Medicare and self-pay.  [facility]  [address], [place] Phone: [phone], Fax: [phone]. Hours of Operation:  8:30 am - 5:30 pm, M-F.  Also accepts Medicaid and self-pay.  [facility] [place] [address] [place] Phone: [phone]   [facility] [address] Natasha Bence [place], [place] [phone], Ext. [phone] Mondays & Thursdays: 7-9 AM.  First 15 patients are seen on a first come, first serve basis.    Medicaid-accepting [facility] Providers:  Organization         Address  Phone   Notes  [facility] [address], [place] [phone] Also accepts self-pay patients.  [facility] [address] Laurell Josephs [place], [place]  [phone]   [facility] [address], [place] [phone]   [facility] [address] [place] [phone]   Renaye Rakers [address], [place]   [phone] Only accepts [place] patients after they have their name applied to their card.   Self-Pay (no insurance) in [facility]:  Organization         Address  Phone   Notes  Sickle Cell Patients, [facility] [address] [place], [place] [phone]   [facility] [address] [place], [place] [phone]   [facility] [place]  [address], [place] [phone]   [facility]/Dr. Osei-Bonsu  [address] [place] or [address], [place] [phone] Phone number for both [place] and [place] locations is the same.  [facility] [address] Ginette Otto [phone]   [facility] [address] [place] [address], [place] or [address] [phone]) [phone]   [facility] [address], [place] [phone]) [phone], phone; [phone](336) [phone], fax Sees patients 1st and 3rd Saturday of every month.  Must not qualify for public or private insurance (i.e. Medicaid, Medicare, [place] Health Choice, Veterans' Benefits)  Household income should be no more than 200% of the poverty level The clinic cannot treat you if you are pregnant or think you are pregnant  Sexually transmitted diseases are not treated at the clinic.    Dental Care: Organization         Address  Phone  Notes  [facility] [address], [place] [phone] Accepts children up to age 36 who are enrolled in [place]icaid or [place] Health Choice; pregnant women with a Medicaid card; and children who have applied for Medicaid or [place] Health Choice, but were declined, whose parents can pay a reduced fee at time of service.  [facility] [place]  [address], [place]  [phone]) [phone] Accepts children up to age 36 who are enrolled in [place]icaid or [place] Health Choice; pregnant women with a Medicaid card; and children who have applied for Medicaid or [place] Health Choice, but were declined, whose parents can pay a reduced fee at time of service.  [facility]  [address], [place] [phone]) [phone] Patients are seen by appointment only. Walk-ins are not accepted. [facility] will see patients 36 years of age and older. Monday - Tuesday (8am-5pm) Most Wednesdays (8:30-5pm) $30 per visit, cash only  [facility]  [address], [facility] [phone]) [phone] Patients are seen by appointment only. Walk-ins are not accepted. [facility] will see patients 36 years of age and older. One Wednesday Evening (Monthly: Volunteer Based).  $30 per visit, cash only  [employer] [facility] [facility]  [phone] for adults; Children under age 24, call Graduate Pediatric Dentistry at [phone]) [phone]. Children aged 504-14, please call [phone] to request a pediatric application.  Dental services are provided in all areas of dental care including fillings, crowns and bridges, complete and partial dentures, implants, gum treatment, root canals, and extractions. Preventive care is also provided. Treatment is provided to both adults and children. Patients are selected via a lottery and there is often a waiting list.   [facility] [address], [place]  [phone](336) [phone] [email or web address]   [facility] [address], [place], [place] [phone], Ext. [phone] Second and Fourth Thursday of each month, opens at 6:30 AM; Clinic ends at 9 AM.  Patients are seen on a first-come first-served basis, and a limited number are seen during each clinic.   [facility]  [address], [place], [place] [phone]   Eligibility Requirements You must have lived in [place], [place], or [place]  counties for at least the last three months.   You cannot be eligible for state or federal sponsored National Cityhealthcare insurance, including CIGNAVeterans Administration, [place]icaid, or [employer] EntertainmentMedicare.   You generally cannot be eligible for healthcare insurance through your employer.    How to apply: Eligibility screenings are held every Tuesday and Wednesday afternoon from 1:00 pm until 4:00 pm. You do not need an appointment for the interview!  [facility] [address], [place], [place] [phone]   [facility]aron [place]  Electronic Data SystemsCounty Health Department  [phone]   [facility]  [phone]   [facility]  [phone]    Behavioral Health Resources in the Community: Intensive Outpatient Programs Organization         Address  Phone  Notes  [facility] [address], [place], [place] [phone]   [facility] [address], [place], [place] [phone]   [facility] [address], [place], [place]  [phone]   [facility] [address],  [place], [place] [phone] or [phone]   Substance Abuse Resources Organization         Address  Phone  Notes  [facility]  [phone]   [facility]  [phone]   [facility]  [phone]   [facility]  [phone]   Residential & Outpatient Substance Abuse Program  [phone]   Psychological Services Organization         Address  Phone  Notes  [facility]  [phone]- [phone]   [facility]  [phone]   [facility] [address], [place] [phone] or [phone]    Mobile Crisis Teams Organization         Address  Phone  Notes  [facility], Mobile Crisis Care Unit  [phone]   [facility]  [address] [place], [place] [phone]   Doristine LocksSharon DeEsch [address] [place]  [place] [phone]    Self-Help/Support Groups Organization         Address  Phone             Notes  [facility] [place] - variety of support groups  [phone]- [phone] Call for more information  Narcotics Anonymous (NA), Caring [facility] [address], [place]  2 meetings at this location   Statisticianesidential Treatment Programs Organization         Address  Phone  Notes  [facility] [address],    [place]  [phone]   [facility]  [address], [place] [phone], [place], [place] [phone]   [facility] [address], [place] [phone] Admissions: 8am-3pm M-F  [facility] [address]    [place], [place] [phone]   [facility] [address], [place], [place] [phone]   [facility] [address]  [place], [place] [phone]   [facility] - Intensive Outpatient [address], [place], [place] [phone]   [facility] ([facility] [address]  [place], [place] [phone] or [phone]   [facility] ([facility]) [address] [place], [place] [phone] Accepts Medicaid  [facility] [address]  [place] [phone] Substance Abuse/Addiction Treatment   [facility] Behavioral Health Resources Organization         Address  Phone  Notes  [facility]  [phone]) [phone]   Angie FavaJulie Brannon, PhD [address], [address] [place], [place]   [phone](336) [phone] or [phone](336) [phone]   [facility]   [address] [place], [place] [phone]   [facility] [address], [place], [place] [phone]) [phone] Insurance/Medicaid/sponsorship through [facility]Center[facility]  Faith and Families [address]  [place], [place] [phone] [place], [place] [phone]    Dr. Adele Schilder  [phone]   [facility] Dept. 1) [address] [place] 2) [place] 3)  [address], [place] [phone]  [phone]   [place] [phone] or [phone] (After Hours)

## 2014-03-17 NOTE — ED Notes (Signed)
Abdominal binder placed on patient.

## 2014-03-17 NOTE — ED Provider Notes (Signed)
CSN: [id number]     Arrival date & time 03/17/14  0747 History   First MD Initiated Contact with Patient 03/17/14 [phone]     Chief Complaint  Patient presents with  . Abdominal Pain     (Consider location/radiation/quality/duration/timing/severity/associated sxs/prior Treatment) HPI  Last night when she came home from rapid presence at her mother's house the patient noticed a knot in her abdomen after she took off her jeans. She reports that the area is very painful. It is much worse if she moves. She has not had any associated symptoms. The patient denies any prior history of a hernia or a surgery. She is not aware that developed with any particular lifting or movement.  Past Medical History  Diagnosis Date  . H/O malignant hyperthermia   . Malignant hyperthermia    Past Surgical History  Procedure Laterality Date  . Ventriculoperitoneal shunt     No family history on file. History  Substance Use Topics  . Smoking status: Current Every Day Smoker -- 0.50 packs/day for 15 years    Types: Cigarettes  . Smokeless tobacco: Not on file  . Alcohol Use: Yes     Comment: occasionally   OB History    No data available     Review of Systems  10 Systems reviewed and are negative for acute change except as noted in the HPI.   Allergies  Review of patient's allergies indicates no known allergies.  Home Medications   Prior to Admission medications   Medication Sig Start Date End Date Taking? Authorizing Provider  ibuprofen (ADVIL,MOTRIN) 200 MG tablet Take 600 mg by mouth every 6 (six) hours as needed for pain (as needed for pain).    Yes Historical Provider, MD  diazepam (VALIUM) 5 MG tablet Take 1 tablet (5 mg total) by mouth every 6 (six) hours as needed for anxiety (spasms). 05/21/12   Nicole Pisciotta, PA-C  HYDROcodone-acetaminophen (NORCO/VICODIN) 5-325 MG per tablet Take 1-2 tablets by mouth every 4 (four) hours as needed for moderate pain or severe pain. Patient not  taking: Reported on 03/17/2014 12/23/13   Harle BattiestElizabeth Tysinger, NP  promethazine (PHENERGAN) 25 MG tablet Take 1 tablet (25 mg total) by mouth every 6 (six) hours as needed for nausea or vomiting. Patient not taking: Reported on 03/17/2014 12/27/13   Joni ReiningNicole Pisciotta, PA-C   BP 106/47 mmHg  Pulse 78  Temp(Src) 97.6 F (36.4 C) (Oral)  Resp 20  SpO2 99% Physical Exam  Constitutional: She is oriented to person, place, and time. She appears well-developed and well-nourished.  HENT:  Head: Normocephalic and atraumatic.  Eyes: EOM are normal.  Pulmonary/Chest: Effort normal.  Abdominal: Soft. Bowel sounds are normal. She exhibits mass. She exhibits no distension. There is tenderness. There is no rebound and no guarding.  At the umbilicus the patient has an approximately 2 and half centimeter sized palpable hernia. The size and consistency is comparable to a grape. There is no associated erythema or induration. The remainder of the abdomen is completely soft without reproducible pain.  Musculoskeletal: Normal range of motion.  Neurological: She is alert and oriented to person, place, and time.  Skin: Skin is warm and dry.  Psychiatric: She has a normal mood and affect.    ED Course  Hernia reduction Date/Time: 03/17/2014 9:42 AM Performed by: Arby BarrettePFEIFFER, Reyana Leisey Authorized by: Arby BarrettePFEIFFER, Sanayah Munro Consent: Verbal consent obtained. Consent given by: patient Local anesthesia used: no Patient sedated: no Patient tolerance: Patient tolerated the procedure well with no immediate  complications Comments: The patient was given 1 mg IV of Dilaudid and 4 mg IV of Zofran for pain control. A grape-sized umbilical hernia was reduced with direct constant pressure. This was painful for the patient how she tolerated well and it reduced smoothly with pressure.   (including critical care time) Labs Review Labs Reviewed - No data to display  Imaging Review No results found.   EKG Interpretation None      10:40 recheck examination. The patient's abdomen is soft and nontender. MDM   Final diagnoses:  Umbilical hernia without obstruction and without gangrene   The patient will be given a surgical referral list. The patient had a spontaneous umbilical hernia that is been reduced at this point time. Instructions are provided for signs and symptoms to return and follow-up plan.    Arby BarretteMarcy Icarus Partch, MD 03/17/14 [phone]

## 2014-03-17 NOTE — ED Notes (Signed)
Pt c/o lower abd pain,states she noticed a golf ball size knot near umbilicus. Hurts to touch. Pt c/o nausea but no v/d.

## 2016-01-27 ENCOUNTER — Encounter (HOSPITAL_COMMUNITY): Payer: Self-pay | Admitting: *Deleted

## 2016-01-27 ENCOUNTER — Emergency Department (HOSPITAL_COMMUNITY)
Admission: EM | Admit: 2016-01-27 | Discharge: 2016-01-27 | Disposition: A | Discharge status: Home / Self Care | Payer: No Typology Code available for payment source | Attending: Emergency Medicine | Admitting: Emergency Medicine

## 2016-01-27 DIAGNOSIS — R21 Rash and other nonspecific skin eruption: Secondary | ICD-10-CM | POA: Insufficient documentation

## 2016-01-27 DIAGNOSIS — F17210 Nicotine dependence, cigarettes, uncomplicated: Secondary | ICD-10-CM | POA: Insufficient documentation

## 2016-01-27 MED ORDER — PREDNISONE 20 MG PO TABS: 40.0000 mg | ORAL_TABLET | Freq: Every day | ORAL | 0 refills | Status: DC

## 2016-01-27 MED ORDER — HYDROXYZINE HCL 25 MG PO TABS: 25.0000 mg | ORAL_TABLET | Freq: Four times a day (QID) | ORAL | 0 refills | Status: DC

## 2016-01-27 NOTE — ED Provider Notes (Signed)
[facility]EMERGENCY DEPT Provider Note   CSN: [id number] Arrival date & time: 01/27/16  4098  By signing my name below, I, Danielle Hayden, attest that this documentation has been prepared under the direction and in the presence of Danielle Strohmeier, PA-C. Electronically Signed: Sonum Hayden, Neurosurgeon. 01/27/16. 10:14 AM.  History   Chief Complaint Chief Complaint  Patient presents with  . Rash   The history is provided by the patient. No language interpreter was used.     HPI Comments: Danielle Hayden is a 40 y.o. female who presents to the Emergency Department complaining of 4 days of a constant, unchanged rash to the trunk and bilateral upper extremities. She denies associated itching, burning, or pain. She states warm baths/showers worsens the redness but Benadryl provided some relief. She denies using new body/hygiene products, taking any new medications, or eating anything new. She denies hands or feet involvement. She denies oral lesions, genital lesions, fever, URI symptoms. 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 She states her vaccines are UTD. She states she donates plasma and is regularly tested for HIV, syphilis, etc.   Past Medical History:  Diagnosis Date  . H/O malignant hyperthermia   . Malignant hyperthermia     There are no active problems to display for this patient.   Past Surgical History:  Procedure Laterality Date  . VENTRICULOPERITONEAL SHUNT      OB History    No data available       Home Medications    Prior to Admission medications   Medication Sig Start Date End Date Taking? Authorizing Provider  diazepam (VALIUM) 5 MG tablet Take 1 tablet (5 mg total) by mouth every 6 (six) hours as needed for anxiety (spasms). 05/21/12   Nicole Pisciotta, PA-C  HYDROcodone-acetaminophen (NORCO/VICODIN) 5-325 MG per tablet Take 1-2 tablets by mouth every 4 (four) hours as needed for moderate pain or severe pain. Patient not taking: Reported on 03/17/2014 12/23/13   Harle Battiest, NP  ibuprofen (ADVIL,MOTRIN) 200 MG tablet Take 600 mg by mouth every 6 (six) hours as needed for pain (as needed for pain).     Historical Provider, MD  promethazine (PHENERGAN) 25 MG tablet Take 1 tablet (25 mg total) by mouth every 6 (six) hours as needed for nausea or vomiting. Patient not taking: Reported on 03/17/2014 12/27/13   Wynetta Emery, PA-C    Family History History reviewed. No pertinent family history.  Social History Social History  Substance Use Topics  . Smoking status: Current Every Day Smoker    Packs/day: 0.50    Years: 15.00    Types: Cigarettes  . Smokeless tobacco: Never Used  . Alcohol use Yes     Comment: occasionally     Allergies   Review of patient's allergies indicates no known allergies.   Review of Systems Review of Systems  Constitutional: Negative for fever.  HENT: Negative for mouth sores.   Genitourinary: Negative for genital sores.  Skin: Positive for rash.     Physical Exam Updated Vital Signs BP 140/85 (BP Location: Right Arm)   Pulse 91   Temp 98 F (36.7 C) (Oral)   Resp 16   Ht 4\' 11"  (1.499 m)   Wt 130 lb (59 kg)   SpO2 100%   BMI 26.26 kg/m   Physical Exam  Constitutional: She is oriented to person, place, and time. She appears well-developed and well-nourished.  HENT:  Head: Normocephalic and atraumatic.  Mouth/Throat: Oropharynx is clear and moist.  Cardiovascular: Normal rate,  regular rhythm and normal heart sounds.   Pulmonary/Chest: Effort normal and breath sounds normal. No respiratory distress. She has no wheezes.  Neurological: She is alert and oriented to person, place, and time.  Skin: Skin is warm and dry.  Erythematous papular raised rash to bilateral arms, worse on the flexor surfaces, bilateral back and chest. No rash to the lower extremities. No lesions to the palms or soles. No oral mucosal lesions.  Psychiatric: She has a normal mood and affect.  Nursing note and vitals  reviewed.    ED Treatments / Results  DIAGNOSTIC STUDIES: Oxygen Saturation is 100% on RA, normal by my interpretation.    COORDINATION OF CARE: 10:18 AM Discussed treatment plan with pt at bedside and pt agreed to plan.   Labs (all labs ordered are listed, but only abnormal results are displayed) Labs Reviewed - No data to display  EKG  EKG Interpretation None       Radiology No results found.  Procedures Procedures (including critical care time)  Medications Ordered in ED Medications - No data to display   Initial Impression / Assessment and Plan / ED Course  I have reviewed the triage vital signs and the nursing notes.  Pertinent labs & imaging results that were available during my care of the patient were reviewed by me and considered in my medical decision making (see chart for details).  Clinical Course    Patient is here with a rash to upper body. Rash is not pruritic, it is not painful. She has not had any new products or exposures. No new foods. She does not have fever. No URI symptoms. She works with children. Most likely viral rash versus allergic reaction. Will treat with prednisone, Vistaril, Pepcid. Follow-up with primary care doctor or dermatology. Patient is in no acute distress, otherwise asymptomatic. Return precautions discussed. Discussed also with Dr. Rubin PayorPickering, agrees with the plan   Vitals:   01/27/16 0926  BP: 140/85  Pulse: 91  Resp: 16  Temp: 98 F (36.7 C)  TempSrc: Oral  SpO2: 100%  Weight: 59 kg  Height: 4\' 11"  (1.499 m)     Final Clinical Impressions(s) / ED Diagnoses   Final diagnoses:  None    New Prescriptions New Prescriptions   No medications on file   I personally performed the services described in this documentation, which was scribed in my presence. The recorded information has been reviewed and is accurate.    Jaynie Crumbleatyana Ralph Brouwer, PA-C 01/27/16 1034    Benjiman CoreNathan Pickering, MD 01/27/16 [phone]

## 2016-01-27 NOTE — Discharge Instructions (Signed)
Take prednisone, Vistaril, Pepcid. Take prednisone until all gone. If developed any new concerning symptoms return to the ED or follow up with your doctor. If not improving follow-up with dermatology.

## 2016-01-27 NOTE — ED Notes (Signed)
Pt in stable condition upon d/c and ambulates from ED. Pt given [facility] 159 coupon.

## 2016-01-27 NOTE — ED Notes (Signed)
Pt ambulated to room from waiting room, tolerated well. 

## 2016-05-05 ENCOUNTER — Encounter (HOSPITAL_COMMUNITY): Payer: Self-pay | Admitting: Emergency Medicine

## 2016-05-05 ENCOUNTER — Emergency Department (HOSPITAL_COMMUNITY)
Admission: EM | Admit: 2016-05-05 | Discharge: 2016-05-05 | Disposition: A | Discharge status: Home / Self Care | Payer: No Typology Code available for payment source | Attending: Dermatology | Admitting: Dermatology

## 2016-05-05 DIAGNOSIS — F17210 Nicotine dependence, cigarettes, uncomplicated: Secondary | ICD-10-CM | POA: Insufficient documentation

## 2016-05-05 DIAGNOSIS — Z5321 Procedure and treatment not carried out due to patient leaving prior to being seen by health care provider: Secondary | ICD-10-CM | POA: Insufficient documentation

## 2016-05-05 DIAGNOSIS — R1012 Left upper quadrant pain: Secondary | ICD-10-CM | POA: Insufficient documentation

## 2016-05-05 LAB — COMPREHENSIVE METABOLIC PANEL
ALT: 21 U/L (ref 14–54)
AST: 21 U/L (ref 15–41)
Albumin: 3.9 g/dL (ref 3.5–5.0)
Alkaline Phosphatase: 50 U/L (ref 38–126)
Anion gap: 7 (ref 5–15)
BILIRUBIN TOTAL: 0.4 mg/dL (ref 0.3–1.2)
BUN: 7 mg/dL (ref 6–20)
CO2: 24 mmol/L (ref 22–32)
CREATININE: 0.61 mg/dL (ref 0.44–1.00)
Calcium: 8.6 mg/dL — ABNORMAL LOW (ref 8.9–10.3)
Chloride: 106 mmol/L (ref 101–111)
GFR calc Af Amer: >60 mL/min (ref >60)
Glucose, Bld: 104 mg/dL — ABNORMAL HIGH (ref 65–99)
POTASSIUM: 3.5 mmol/L (ref 3.5–5.1)
Sodium: 137 mmol/L (ref 135–145)
TOTAL PROTEIN: 6.5 g/dL (ref 6.5–8.1)

## 2016-05-05 LAB — POC URINE PREG, ED: PREG TEST UR: NEGATIVE

## 2016-05-05 LAB — URINALYSIS, ROUTINE W REFLEX MICROSCOPIC
Bilirubin Urine: NEGATIVE
Glucose, UA: NEGATIVE mg/dL
Hgb urine dipstick: NEGATIVE
KETONES UR: NEGATIVE mg/dL
LEUKOCYTES UA: NEGATIVE
NITRITE: NEGATIVE
Protein, ur: NEGATIVE mg/dL
Specific Gravity, Urine: 1.011 (ref 1.005–1.030)
pH: 7 (ref 5.0–8.0)

## 2016-05-05 LAB — CBC
HCT: 35.9 % — ABNORMAL LOW (ref 36.0–46.0)
Hemoglobin: 12 g/dL (ref 12.0–15.0)
MCH: 30.7 pg (ref 26.0–34.0)
MCHC: 33.4 g/dL (ref 30.0–36.0)
MCV: 91.8 fL (ref 78.0–100.0)
Platelets: 406 10*3/uL — ABNORMAL HIGH (ref 150–400)
RBC: 3.91 MIL/uL (ref 3.87–5.11)
RDW: 14.1 % (ref 11.5–15.5)
WBC: 9.4 10*3/uL (ref 4.0–10.5)

## 2016-05-05 LAB — LIPASE, BLOOD: LIPASE: 26 U/L (ref 11–51)

## 2016-05-05 MED ORDER — OXYCODONE-ACETAMINOPHEN 5-325 MG PO TABS
1.0000 | ORAL_TABLET | ORAL | Status: DC | PRN
Start: 2016-05-05 — End: 2016-05-05
  Administered 2016-05-05: 1 via ORAL
  Filled 2016-05-05: qty 1

## 2016-05-05 NOTE — ED Notes (Addendum)
Patient was informed of the delay, but unhappy with the wait time and expressing wanting to leave due to her children. This Clinical research associatewriter informed patient of how the triage process works, why there was a wait, and that we have a delay due to some urgent patients that need to be treated ,and that we are working as hard as possible. Patient states "I just want my lab results" this writer informed patient that I am unable to give that information. RN Sharon SellerLynnsey made aware and is currently talking to patient  And guest.

## 2016-05-05 NOTE — ED Notes (Signed)
Pt up to registration desk asking about delay and wait time. Advised pt that we are working very hard to get beds ready for our patients and that we have not forgotten about her. Pt reports she cannot wait because she has to go and pick up her kids.

## 2016-05-05 NOTE — ED Triage Notes (Signed)
Pt reports LUQ abd pain that wraps around from midline abd to back for the last week that has gradually gotten worse. No n/v/d, dysuria, or hematuria. No hx of abd surgeries.

## 2016-05-06 ENCOUNTER — Emergency Department (HOSPITAL_COMMUNITY)
Admission: EM | Admit: 2016-05-06 | Discharge: 2016-05-06 | Disposition: A | Discharge status: Home / Self Care | Payer: Self-pay | Attending: Emergency Medicine | Admitting: Emergency Medicine

## 2016-05-06 ENCOUNTER — Emergency Department (HOSPITAL_COMMUNITY): Payer: Self-pay

## 2016-05-06 ENCOUNTER — Encounter (HOSPITAL_COMMUNITY): Payer: Self-pay

## 2016-05-06 DIAGNOSIS — Z79899 Other long term (current) drug therapy: Secondary | ICD-10-CM | POA: Insufficient documentation

## 2016-05-06 DIAGNOSIS — F17210 Nicotine dependence, cigarettes, uncomplicated: Secondary | ICD-10-CM | POA: Insufficient documentation

## 2016-05-06 DIAGNOSIS — R1013 Epigastric pain: Secondary | ICD-10-CM | POA: Insufficient documentation

## 2016-05-06 LAB — COMPREHENSIVE METABOLIC PANEL
ALK PHOS: 44 U/L (ref 38–126)
ALT: 22 U/L (ref 14–54)
ANION GAP: 9 (ref 5–15)
AST: 21 U/L (ref 15–41)
Albumin: 3.5 g/dL (ref 3.5–5.0)
BUN: 7 mg/dL (ref 6–20)
CALCIUM: 8.7 mg/dL — AB (ref 8.9–10.3)
CHLORIDE: 107 mmol/L (ref 101–111)
CO2: 21 mmol/L — AB (ref 22–32)
CREATININE: 0.6 mg/dL (ref 0.44–1.00)
Glucose, Bld: 105 mg/dL — ABNORMAL HIGH (ref 65–99)
Potassium: 3.9 mmol/L (ref 3.5–5.1)
SODIUM: 137 mmol/L (ref 135–145)
Total Bilirubin: 0.4 mg/dL (ref 0.3–1.2)
Total Protein: 5.9 g/dL — ABNORMAL LOW (ref 6.5–8.1)

## 2016-05-06 LAB — CBC WITH DIFFERENTIAL/PLATELET
BASOS PCT: 1 %
Basophils Absolute: 0 10*3/uL (ref 0.0–0.1)
EOS ABS: 0.1 10*3/uL (ref 0.0–0.7)
Eosinophils Relative: 1 %
HEMATOCRIT: 38.3 % (ref 36.0–46.0)
HEMOGLOBIN: 12.9 g/dL (ref 12.0–15.0)
Lymphocytes Relative: 24 %
Lymphs Abs: 2.1 10*3/uL (ref 0.7–4.0)
MCH: 31.2 pg (ref 26.0–34.0)
MCHC: 33.7 g/dL (ref 30.0–36.0)
MCV: 92.5 fL (ref 78.0–100.0)
Monocytes Absolute: 0.4 10*3/uL (ref 0.1–1.0)
Monocytes Relative: 5 %
NEUTROS ABS: 6.1 10*3/uL (ref 1.7–7.7)
NEUTROS PCT: 69 %
Platelets: 411 10*3/uL — ABNORMAL HIGH (ref 150–400)
RBC: 4.14 MIL/uL (ref 3.87–5.11)
RDW: 13.9 % (ref 11.5–15.5)
WBC: 8.7 10*3/uL (ref 4.0–10.5)

## 2016-05-06 LAB — URINALYSIS, ROUTINE W REFLEX MICROSCOPIC
BILIRUBIN URINE: NEGATIVE
Glucose, UA: NEGATIVE mg/dL
Hgb urine dipstick: NEGATIVE
Ketones, ur: NEGATIVE mg/dL
Leukocytes, UA: NEGATIVE
NITRITE: NEGATIVE
PH: 6 (ref 5.0–8.0)
Protein, ur: NEGATIVE mg/dL
SPECIFIC GRAVITY, URINE: 1.011 (ref 1.005–1.030)

## 2016-05-06 LAB — LIPASE, BLOOD: LIPASE: 21 U/L (ref 11–51)

## 2016-05-06 MED ORDER — IOPAMIDOL (ISOVUE-370) INJECTION 76%
INTRAVENOUS | Status: AC
  Administered 2016-05-06: 100 mL
  Filled 2016-05-06: qty 100

## 2016-05-06 MED ORDER — SODIUM CHLORIDE 0.9 % IV BOLUS (SEPSIS)
1000.0000 mL | Freq: Once | INTRAVENOUS | Status: AC
  Administered 2016-05-06: 1000 mL via INTRAVENOUS

## 2016-05-06 MED ORDER — MORPHINE SULFATE (PF) 4 MG/ML IV SOLN
4.0000 mg | Freq: Once | INTRAVENOUS | Status: AC
  Administered 2016-05-06: 4 mg via INTRAVENOUS
  Filled 2016-05-06: qty 1

## 2016-05-06 MED ORDER — FAMOTIDINE 20 MG PO TABS: 20.0000 mg | ORAL_TABLET | Freq: Every day | ORAL | 0 refills | Status: DC

## 2016-05-06 MED ORDER — HYDROCODONE-ACETAMINOPHEN 5-325 MG PO TABS
1.0000 | ORAL_TABLET | Freq: Once | ORAL | Status: AC
  Administered 2016-05-06: 1 via ORAL
  Filled 2016-05-06: qty 1

## 2016-05-06 MED ORDER — ONDANSETRON HCL 4 MG/2ML IJ SOLN
4.0000 mg | Freq: Once | INTRAMUSCULAR | Status: AC
  Administered 2016-05-06: 4 mg via INTRAVENOUS
  Filled 2016-05-06: qty 2

## 2016-05-06 MED ORDER — HYDROCODONE-ACETAMINOPHEN 5-325 MG PO TABS: 1.0000 | ORAL_TABLET | ORAL | 0 refills | Status: DC | PRN

## 2016-05-06 MED ORDER — FAMOTIDINE IN NACL 20-0.9 MG/50ML-% IV SOLN
20.0000 mg | Freq: Once | INTRAVENOUS | Status: AC
  Administered 2016-05-06: 20 mg via INTRAVENOUS
  Filled 2016-05-06: qty 50

## 2016-05-06 NOTE — ED Provider Notes (Signed)
[facility] Provider Note   CSN: [id number] Arrival date & time: 05/06/16  [id number]     History   Chief Complaint Chief Complaint  Patient presents with  . Abdominal Pain  . Back Pain  . Chest Pain    HPI Danielle Hayden is a 41 y.o. female.  Pt presents to the ED with epigastric abdominal pain and LUQ pain.  She said that it starts in the center of her abdomen and wraps around to her back.  She said that it's been increasing in intensity for the past week.  [facility] due to the long wait.  She denies f/c or n/v.  It hurts to take a deep breath and to cough.      Past Medical History:  Diagnosis Date  . H/O malignant hyperthermia   . Malignant hyperthermia     There are no active problems to display for this patient.   Past Surgical History:  Procedure Laterality Date  . VENTRICULOPERITONEAL SHUNT      OB History    No data available       Home Medications    Prior to Admission medications   Medication Sig Start Date End Date Taking? Authorizing Provider  diazepam (VALIUM) 5 MG tablet Take 1 tablet (5 mg total) by mouth every 6 (six) hours as needed for anxiety (spasms). 05/21/12   Nicole Pisciotta, PA-C  famotidine (PEPCID) 20 MG tablet Take 1 tablet (20 mg total) by mouth daily. 05/06/16   Jacalyn LefevreJulie Franca Stakes, MD  HYDROcodone-acetaminophen (NORCO/VICODIN) 5-325 MG tablet Take 1 tablet by mouth every 4 (four) hours as needed. 05/06/16   Jacalyn LefevreJulie Sharronda Schweers, MD  hydrOXYzine (ATARAX/VISTARIL) 25 MG tablet Take 1 tablet (25 mg total) by mouth every 6 (six) hours. 01/27/16   Tatyana Kirichenko, PA-C  ibuprofen (ADVIL,MOTRIN) 200 MG tablet Take 600 mg by mouth every 6 (six) hours as needed for pain (as needed for pain).     Historical Provider, MD  predniSONE (DELTASONE) 20 MG tablet Take 2 tablets (40 mg total) by mouth daily. 01/27/16   Tatyana Kirichenko, PA-C  promethazine (PHENERGAN) 25 MG tablet Take 1 tablet (25 mg total) by mouth every 6 (six)  hours as needed for nausea or vomiting. Patient not taking: Reported on 03/17/2014 12/27/13   Wynetta EmeryNicole Pisciotta, PA-C    Family History History reviewed. No pertinent family history.  Social History Social History  Substance Use Topics  . Smoking status: Current Every Day Smoker    Packs/day: 0.50    Years: 15.00    Types: Cigarettes  . Smokeless tobacco: Never Used  . Alcohol use Yes     Comment: occasionally     Allergies   Patient has no known allergies.   Review of Systems Review of Systems  Gastrointestinal: Positive for abdominal pain.  All other systems reviewed and are negative.    Physical Exam Updated Vital Signs BP 127/69   Pulse 67   Temp 99 F (37.2 C) (Oral)   Resp 14   Ht 4' 11.49" (1.511 m)   Wt 142 lb (64.4 kg)   SpO2 99%   BMI 28.21 kg/m   Physical Exam  Constitutional: She is oriented to person, place, and time. She appears well-developed and well-nourished.  HENT:  Head: Normocephalic and atraumatic.  Right Ear: External ear normal.  Left Ear: External ear normal.  Nose: Nose normal.  Mouth/Throat: Oropharynx is clear and moist.  Eyes: Conjunctivae and EOM are  normal. Pupils are equal, round, and reactive to light.  Neck: Normal range of motion. Neck supple.  Cardiovascular: Normal rate, regular rhythm, normal heart sounds and intact distal pulses.   Pulmonary/Chest: Effort normal and breath sounds normal.  Abdominal: Soft. Bowel sounds are normal. There is tenderness in the epigastric area.  Musculoskeletal: Normal range of motion.  Neurological: She is alert and oriented to person, place, and time.  Skin: Skin is warm.  Psychiatric: She has a normal mood and affect. Her behavior is normal. Judgment and thought content normal.  Nursing note and vitals reviewed.    ED Treatments / Results  Labs (all labs ordered are listed, but only abnormal results are displayed) Labs Reviewed  COMPREHENSIVE METABOLIC PANEL - Abnormal; Notable  for the following:       Result Value   CO2 21 (*)    Glucose, Bld 105 (*)    Calcium 8.7 (*)    Total Protein 5.9 (*)    All other components within normal limits  CBC WITH DIFFERENTIAL/PLATELET - Abnormal; Notable for the following:    Platelets 411 (*)    All other components within normal limits  URINALYSIS, ROUTINE W REFLEX MICROSCOPIC  LIPASE, BLOOD    EKG  EKG Interpretation None       Radiology Ct Angio Chest Pe W And/or Wo Contrast  Result Date: 05/06/2016 CLINICAL DATA:  Left chest pain radiating down left side and into back. EXAM: CT ANGIOGRAPHY CHEST CT ABDOMEN AND PELVIS WITH CONTRAST TECHNIQUE: Multidetector CT imaging of the chest was performed using the standard protocol during bolus administration of intravenous contrast. Multiplanar CT image reconstructions and MIPs were obtained to evaluate the vascular anatomy. Multidetector CT imaging of the abdomen and pelvis was performed using the standard protocol during bolus administration of intravenous contrast. CONTRAST:  100 cc Isovue 370 IV COMPARISON:  None. FINDINGS: CTA CHEST FINDINGS Cardiovascular: Heart is borderline in size. Aorta is normal caliber. No dissection. No filling defects in the pulmonary arteries to suggest pulmonary emboli. Mediastinum/Nodes: No mediastinal, hilar, or axillary adenopathy. Lungs/Pleura: Dependent atelectasis in the lungs. No confluent opacities otherwise. No effusions. Musculoskeletal: No acute bony abnormality or focal bone lesion. Review of the MIP images confirms the above findings. CT ABDOMEN and PELVIS FINDINGS Hepatobiliary: No focal hepatic abnormality. Gallbladder unremarkable. Pancreas: No focal abnormality or ductal dilatation. Spleen: No focal abnormality.  Normal size. Adrenals/Urinary Tract: No adrenal abnormality. No focal renal abnormality. No stones or hydronephrosis. Urinary bladder is unremarkable. Stomach/Bowel: Stomach, large and small bowel grossly unremarkable.  Vascular/Lymphatic: No evidence of aneurysm or adenopathy. Reproductive: IUD is noted within the uterus, oriented transversely across the uterus. No adnexal masses. Other: Trace free fluid in the pelvis. 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 There appears to be a discontinuous segment within the lower chest subcutaneous soft tissues over approximately 3.5 cm length. Musculoskeletal: No acute bony abnormality. Review of the MIP images confirms the above findings. IMPRESSION: No evidence of pulmonary embolus. No acute findings in the chest, abdomen or pelvis. Ventriculoperitoneal shunt catheter appears discontinuous in the lower chest wall over approximately 3.5 cm segment. Electronically Signed   By: Charlett Nose M.D.   On: 05/06/2016 09:49   Ct Abdomen Pelvis W Contrast  Result Date: 05/06/2016 CLINICAL DATA:  Left chest pain radiating down left side and into back. EXAM: CT ANGIOGRAPHY CHEST CT ABDOMEN AND PELVIS WITH CONTRAST TECHNIQUE:  Multidetector CT imaging of the chest was performed using the standard protocol during bolus administration of intravenous contrast. Multiplanar CT image reconstructions and MIPs were obtained to evaluate the vascular anatomy. Multidetector CT imaging of the abdomen and pelvis was performed using the standard protocol during bolus administration of intravenous contrast. CONTRAST:  100 cc Isovue 370 IV COMPARISON:  None. FINDINGS: CTA CHEST FINDINGS Cardiovascular: Heart is borderline in size. Aorta is normal caliber. No dissection. No filling defects in the pulmonary arteries to suggest pulmonary emboli. Mediastinum/Nodes: No mediastinal, hilar, or axillary adenopathy. Lungs/Pleura: Dependent atelectasis in the lungs. No confluent opacities otherwise. No effusions. Musculoskeletal: No acute bony abnormality or focal bone lesion. Review of the MIP images confirms the  above findings. CT ABDOMEN and PELVIS FINDINGS Hepatobiliary: No focal hepatic abnormality. Gallbladder unremarkable. Pancreas: No focal abnormality or ductal dilatation. Spleen: No focal abnormality.  Normal size. Adrenals/Urinary Tract: No adrenal abnormality. No focal renal abnormality. No stones or hydronephrosis. Urinary bladder is unremarkable. Stomach/Bowel: Stomach, large and small bowel grossly unremarkable. Vascular/Lymphatic: No evidence of aneurysm or adenopathy. Reproductive: IUD is noted within the uterus, oriented transversely across the uterus. No adnexal masses. Other: Trace free fluid in the pelvis. 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 There appears to be a discontinuous segment within the lower chest subcutaneous soft tissues over approximately 3.5 cm length. Musculoskeletal: No acute bony abnormality. Review of the MIP images confirms the above findings. IMPRESSION: No evidence of pulmonary embolus. No acute findings in the chest, abdomen or pelvis. Ventriculoperitoneal shunt catheter appears discontinuous in the lower chest wall over approximately 3.5 cm segment. Electronically Signed   By: Charlett Nose M.D.   On: 05/06/2016 09:49    Procedures Procedures (including critical care time)  Medications Ordered in ED Medications  HYDROcodone-acetaminophen (NORCO/VICODIN) 5-325 MG per tablet 1 tablet (not administered)  sodium chloride 0.9 % bolus 1,000 mL (1,000 mLs Intravenous New Bag/Given 05/06/16 0818)  morphine 4 MG/ML injection 4 mg (4 mg Intravenous Given 05/06/16 0818)  ondansetron (ZOFRAN) injection 4 mg (4 mg Intravenous Given 05/06/16 0818)  famotidine (PEPCID) IVPB 20 mg premix (0 mg Intravenous Stopped 05/06/16 0849)  iopamidol (ISOVUE-370) 76 % injection (100 mLs  Contrast Given 05/06/16 0914)     Initial Impression / Assessment and Plan / ED Course  I have  reviewed the triage vital signs and the nursing notes.  Pertinent labs & imaging results that were available during my care of the patient were reviewed by me and considered in my medical decision making (see chart for details).    Pt aware of the discontinuous segment of the VP shunt from an eval of it when she was pregnant.  Pt's CT chest/abd/pelvis otherwise ok.  She will be given the number for GI to f/u.  She knows to return if worse.  Final Clinical Impressions(s) / ED Diagnoses   Final diagnoses:  Epigastric abdominal pain    New Prescriptions New Prescriptions   FAMOTIDINE (PEPCID) 20 MG TABLET    Take 1 tablet (20 mg total) by mouth daily.   HYDROCODONE-ACETAMINOPHEN (NORCO/VICODIN) 5-325 MG TABLET    Take 1 tablet by mouth every 4 (four) hours as needed.     Jacalyn Lefevre, MD 05/06/16 1014

## 2016-05-06 NOTE — ED Triage Notes (Signed)
Pt reports epigastric/LUQ abd pain/chest pain x 1 week. Pt reports pain wraps around to back from abd. Denies n/v/d/urinary symptoms. Pt states difficult to take a deep breath. Abd tender to palpation.

## 2016-05-06 NOTE — ED Notes (Signed)
Pt transported to CT ?

## 2016-11-06 ENCOUNTER — Encounter (HOSPITAL_COMMUNITY): Payer: Self-pay | Admitting: *Deleted

## 2016-11-06 ENCOUNTER — Emergency Department (HOSPITAL_COMMUNITY)
Admission: EM | Admit: 2016-11-06 | Discharge: 2016-11-06 | Disposition: A | Discharge status: Home / Self Care | Payer: No Typology Code available for payment source | Attending: Emergency Medicine | Admitting: Emergency Medicine

## 2016-11-06 DIAGNOSIS — K0889 Other specified disorders of teeth and supporting structures: Secondary | ICD-10-CM | POA: Insufficient documentation

## 2016-11-06 DIAGNOSIS — R6884 Jaw pain: Secondary | ICD-10-CM | POA: Insufficient documentation

## 2016-11-06 DIAGNOSIS — F17210 Nicotine dependence, cigarettes, uncomplicated: Secondary | ICD-10-CM | POA: Insufficient documentation

## 2016-11-06 DIAGNOSIS — Z79899 Other long term (current) drug therapy: Secondary | ICD-10-CM | POA: Insufficient documentation

## 2016-11-06 MED ORDER — PENICILLIN V POTASSIUM 500 MG PO TABS: 500.0000 mg | ORAL_TABLET | Freq: Four times a day (QID) | ORAL | 0 refills | Status: AC

## 2016-11-06 MED ORDER — NAPROXEN 500 MG PO TABS: 500.0000 mg | ORAL_TABLET | Freq: Two times a day (BID) | ORAL | 0 refills | Status: DC

## 2016-11-06 MED ORDER — KETOROLAC TROMETHAMINE 30 MG/ML IJ SOLN
30.0000 mg | Freq: Once | INTRAMUSCULAR | Status: AC
  Administered 2016-11-06: 30 mg via INTRAMUSCULAR
  Filled 2016-11-06: qty 1

## 2016-11-06 MED ORDER — BENZOCAINE 10 % MT GEL: 1.0000 "application " | OROMUCOSAL | 0 refills | Status: DC | PRN

## 2016-11-06 NOTE — ED Provider Notes (Signed)
[facility]-EMERGENCY DEPT Provider Note   CSN: [id number] Arrival date & time: 11/06/16  1037  By signing my name below, I, Vista Minkobert Ross, attest that this documentation has been prepared under the direction and in the presence of Arch Methot PA-C.  Electronically Signed: Vista Minkobert Ross, ED Scribe. 11/06/16. 11:39 AM.  History   Chief Complaint Chief Complaint  Patient presents with  . Dental Pain  . Jaw Pain   HPI HPI Comments: Danielle Hayden is a 41 y.o. female who presents to the Emergency Department complaining of persistent lower dental pain and jaw pain that started two days ago. Pt started experiencing the pain after donating plasma on Friday. Pt has had persistent pain in her lower jaw on both sides. Pt notes that the pain intermittently waxes and wanes on either side, with radiation to her head causing a mild headache and ear pain on the same side. She reports an exacerbation of pain when attempting to eat or drink but is able to do so. Pt also notes Hx of similar pain with previous dental abscess. She notes that she has a "bump" on the roof of her mouth which she notes concern for a recurring abscess. She has tried Tylenol, Advil, salt water, and peroxide rinses without relief. Pt has not noted any bleeding from her gums. No known sick contacts. No fever or chills. No chest pain, shortness of breath, vomiting or abdominal pain. She is not having difficulty handling her secretions.  The history is provided by the patient. No language interpreter was used.    Past Medical History:  Diagnosis Date  . H/O malignant hyperthermia   . Malignant hyperthermia     There are no active problems to display for this patient.   Past Surgical History:  Procedure Laterality Date  . VENTRICULOPERITONEAL SHUNT      OB History    No data available       Home Medications    Prior to Admission medications   Medication Sig Start Date End Date Taking? Authorizing Provider  benzocaine (ORAJEL)  10 % mucosal gel Use as directed 1 application in the mouth or throat as needed for mouth pain. 11/06/16   Marcellus Pulliam, PA-C  diazepam (VALIUM) 5 MG tablet Take 1 tablet (5 mg total) by mouth every 6 (six) hours as needed for anxiety (spasms). 05/21/12   Pisciotta, Joni ReiningNicole, PA-C  famotidine (PEPCID) 20 MG tablet Take 1 tablet (20 mg total) by mouth daily. 05/06/16   Jacalyn LefevreHaviland, Julie, MD  HYDROcodone-acetaminophen (NORCO/VICODIN) 5-325 MG tablet Take 1 tablet by mouth every 4 (four) hours as needed. 05/06/16   Jacalyn LefevreHaviland, Julie, MD  hydrOXYzine (ATARAX/VISTARIL) 25 MG tablet Take 1 tablet (25 mg total) by mouth every 6 (six) hours. 01/27/16   Kirichenko, Tatyana, PA-C  ibuprofen (ADVIL,MOTRIN) 200 MG tablet Take 600 mg by mouth every 6 (six) hours as needed for pain (as needed for pain).     [provider]  naproxen (NAPROSYN) 500 MG tablet Take 1 tablet (500 mg total) by mouth 2 (two) times daily. 11/06/16   Crystallynn Noorani, PA-C  penicillin v potassium (VEETID) 500 MG tablet Take 1 tablet (500 mg total) by mouth 4 (four) times daily. 11/06/16 11/13/16  Brandley Aldrete, PA-C  predniSONE (DELTASONE) 20 MG tablet Take 2 tablets (40 mg total) by mouth daily. 01/27/16   Kirichenko, Tatyana, PA-C  promethazine (PHENERGAN) 25 MG tablet Take 1 tablet (25 mg total) by mouth every 6 (six) hours as needed for nausea or vomiting.  Patient not taking: Reported on 03/17/2014 12/27/13   Pisciotta, Mardella Layman    Family History History reviewed. No pertinent family history.  Social History Social History  Substance Use Topics  . Smoking status: Current Every Day Smoker    Packs/day: 0.50    Years: 15.00    Types: Cigarettes  . Smokeless tobacco: Never Used  . Alcohol use Yes     Comment: occasionally     Allergies   Patient has no known allergies.   Review of Systems Review of Systems  Constitutional: Negative for chills and fever.  HENT: Positive for dental problem. Negative for drooling,  rhinorrhea, sore throat and trouble swallowing.   Respiratory: Negative for shortness of breath.   Cardiovascular: Negative for chest pain.  Gastrointestinal: Negative for nausea and vomiting.  Allergic/Immunologic: Negative for immunocompromised state.  Neurological: Positive for headaches.    Physical Exam Updated Vital Signs BP 119/90 (BP Location: Right Arm)   Pulse 90   Temp 98.1 F (36.7 C) (Oral)   Resp 18   SpO2 99%   Physical Exam  Constitutional: She is oriented to person, place, and time. She appears well-developed and well-nourished. No distress.  HENT:  Head: Normocephalic and atraumatic.  Nose: Nose normal.  Mouth/Throat: Uvula is midline, oropharynx is clear and moist and mucous membranes are normal. No trismus in the jaw. Abnormal dentition. Dental caries present. No uvula swelling.  Generalized swelling of the gingiva. Posterior right molar cracked with surrounding erythema and edema of the gingiva. No obvious abscess seen. Multiple other dental caries noted. Patient with mild erythema of the roof of her mouth, but no obvious abscess or fluctuance noted. No uvular swelling or deviation. Palate rise equal. No pain under the tongue. No trismus. Patient handling secretions well.  Eyes: EOM are normal.  Neck: Normal range of motion. Neck supple.  Cardiovascular: Normal rate, regular rhythm and intact distal pulses.   Pulmonary/Chest: Effort normal and breath sounds normal. No respiratory distress. She has no wheezes.  Abdominal: Soft. She exhibits no distension. There is no tenderness.  Musculoskeletal: Normal range of motion.  Lymphadenopathy:    She has no cervical adenopathy.  Neurological: She is alert and oriented to person, place, and time.  Skin: Skin is warm and dry. She is not diaphoretic.  Psychiatric: She has a normal mood and affect.  Nursing note and vitals reviewed.    ED Treatments / Results  DIAGNOSTIC STUDIES: Oxygen Saturation is 99% on RA,  normal by my interpretation.  COORDINATION OF CARE: 11:39 AM-Discussed treatment plan with pt at bedside and pt agreed to plan.   Labs (all labs ordered are listed, but only abnormal results are displayed) Labs Reviewed - No data to display  EKG  EKG Interpretation None       Radiology No results found.  Procedures Procedures (including critical care time)  Medications Ordered in ED Medications  ketorolac (TORADOL) 30 MG/ML injection 30 mg (30 mg Intramuscular Given 11/06/16 1150)     Initial Impression / Assessment and Plan / ED Course  I have reviewed the triage vital signs and the nursing notes.  Pertinent labs & imaging results that were available during my care of the patient were reviewed by me and considered in my medical decision making (see chart for details).     Patient presenting with 3 days jaw pain. Physical exam shows poor dentition, generalized gingival edema, and increased erythema and edema surrounding the right posterior molar. Concern for possible infection of  the right posterior molar, and generalized gingivitis. No evidence for Ludvigs or peritonsillar abscess at this time. Erythema on the roof of mouth does not appear to be infectious at this time, and may be irritation. Will treat with antibiotics, NSAIDs, and Orajel. Patient requests treatment in the emergency room, will give shot of ketorolac. Patient instructed to follow-up with dentist for evaluation of her tooth and the area on the roof of her mouth. Patient appears safe for discharge at this time. Return precautions given. Patient states she understands and agrees to plan.  Final Clinical Impressions(s) / ED Diagnoses   Final diagnoses:  Pain, dental    New Prescriptions New Prescriptions   BENZOCAINE (ORAJEL) 10 % MUCOSAL GEL    Use as directed 1 application in the mouth or throat as needed for mouth pain.   NAPROXEN (NAPROSYN) 500 MG TABLET    Take 1 tablet (500 mg total) by mouth 2 (two)  times daily.   PENICILLIN V POTASSIUM (VEETID) 500 MG TABLET    Take 1 tablet (500 mg total) by mouth 4 (four) times daily.  I personally performed the services described in this documentation, which was scribed in my presence. The recorded information has been reviewed and is accurate.     Alveria ApleyCaccavale, Marija Calamari, PA-C 11/06/16 1201    Gerhard MunchLockwood, Robert, MD 11/06/16 [phone]

## 2016-11-06 NOTE — Discharge Instructions (Signed)
Use naproxen twice a day as needed for pain. Take this with meals. Do not use other anti-inflammatories at the same time (Advil, Ultram, ibuprofen, Aleve). You may supplement with Tylenol as needed. Take antibiotics as prescribed. You may use the Orajel as needed for mouth pain. It is important that you follow up with a dentist for further evaluation of her teeth. You may contact the dentist (information provided below), or use the resource guide provided in this packet to find a different dentist. Return to the emergency department if you develop persistent high fevers, vomiting, difficulty breathing, or any new or worsening symptoms.

## 2016-11-06 NOTE — ED Triage Notes (Signed)
Pt reports having multiple teeth that are hurting her on left side. Now also having jaw pain. Pain more severe since this am. Airway intact.

## 2017-06-08 ENCOUNTER — Emergency Department (HOSPITAL_COMMUNITY): Payer: Self-pay

## 2017-06-08 ENCOUNTER — Encounter (HOSPITAL_COMMUNITY): Payer: Self-pay | Admitting: Emergency Medicine

## 2017-06-08 ENCOUNTER — Emergency Department (HOSPITAL_COMMUNITY)
Admission: EM | Admit: 2017-06-08 | Discharge: 2017-06-08 | Disposition: A | Discharge status: Home / Self Care | Payer: Self-pay | Attending: Emergency Medicine | Admitting: Emergency Medicine

## 2017-06-08 DIAGNOSIS — Z79899 Other long term (current) drug therapy: Secondary | ICD-10-CM | POA: Insufficient documentation

## 2017-06-08 DIAGNOSIS — F17210 Nicotine dependence, cigarettes, uncomplicated: Secondary | ICD-10-CM | POA: Insufficient documentation

## 2017-06-08 DIAGNOSIS — R109 Unspecified abdominal pain: Secondary | ICD-10-CM | POA: Insufficient documentation

## 2017-06-08 LAB — URINALYSIS, ROUTINE W REFLEX MICROSCOPIC
BILIRUBIN URINE: NEGATIVE
Glucose, UA: NEGATIVE mg/dL
Hgb urine dipstick: NEGATIVE
Ketones, ur: 5 mg/dL — AB
Nitrite: NEGATIVE
PROTEIN: NEGATIVE mg/dL
Specific Gravity, Urine: 1.018 (ref 1.005–1.030)
pH: 6 (ref 5.0–8.0)

## 2017-06-08 LAB — CBC
HEMATOCRIT: 44.1 % (ref 36.0–46.0)
Hemoglobin: 14.9 g/dL (ref 12.0–15.0)
MCH: 31 pg (ref 26.0–34.0)
MCHC: 33.8 g/dL (ref 30.0–36.0)
MCV: 91.7 fL (ref 78.0–100.0)
Platelets: 443 10*3/uL — ABNORMAL HIGH (ref 150–400)
RBC: 4.81 MIL/uL (ref 3.87–5.11)
RDW: 14.1 % (ref 11.5–15.5)
WBC: 11.1 10*3/uL — ABNORMAL HIGH (ref 4.0–10.5)

## 2017-06-08 LAB — COMPREHENSIVE METABOLIC PANEL
ALBUMIN: 3.7 g/dL (ref 3.5–5.0)
ALT: 22 U/L (ref 14–54)
AST: 21 U/L (ref 15–41)
Alkaline Phosphatase: 47 U/L (ref 38–126)
Anion gap: 6 (ref 5–15)
BUN: 7 mg/dL (ref 6–20)
CHLORIDE: 109 mmol/L (ref 101–111)
CO2: 25 mmol/L (ref 22–32)
Calcium: 8.5 mg/dL — ABNORMAL LOW (ref 8.9–10.3)
Creatinine, Ser: 0.62 mg/dL (ref 0.44–1.00)
GFR calc Af Amer: >60 mL/min (ref >60)
GFR calc non Af Amer: >60 mL/min (ref >60)
GLUCOSE: 101 mg/dL — AB (ref 65–99)
Potassium: 4.3 mmol/L (ref 3.5–5.1)
Sodium: 140 mmol/L (ref 135–145)
Total Bilirubin: 0.6 mg/dL (ref 0.3–1.2)
Total Protein: 6.5 g/dL (ref 6.5–8.1)

## 2017-06-08 LAB — LIPASE, BLOOD: LIPASE: 29 U/L (ref 11–51)

## 2017-06-08 LAB — D-DIMER, QUANTITATIVE (NOT AT ARMC): D DIMER QUANT: 0.31 ug{FEU}/mL (ref 0.00–0.50)

## 2017-06-08 LAB — TROPONIN I: Troponin I: <0.03 ng/mL (ref <0.03)

## 2017-06-08 LAB — POC URINE PREG, ED: PREG TEST UR: NEGATIVE

## 2017-06-08 MED ORDER — ONDANSETRON 4 MG PO TBDP: ORAL_TABLET | ORAL | 0 refills | Status: DC

## 2017-06-08 MED ORDER — SODIUM CHLORIDE 0.9 % IV SOLN
1.0000 g | Freq: Once | INTRAVENOUS | Status: AC
  Administered 2017-06-08: 1 g via INTRAVENOUS
  Filled 2017-06-08: qty 10

## 2017-06-08 MED ORDER — HYDROCODONE-ACETAMINOPHEN 5-325 MG PO TABS
1.0000 | ORAL_TABLET | Freq: Once | ORAL | Status: AC
  Administered 2017-06-08: 1 via ORAL
  Filled 2017-06-08: qty 1

## 2017-06-08 MED ORDER — ONDANSETRON HCL 4 MG/2ML IJ SOLN
4.0000 mg | Freq: Once | INTRAMUSCULAR | Status: AC
  Administered 2017-06-08: 4 mg via INTRAVENOUS
  Filled 2017-06-08: qty 2

## 2017-06-08 MED ORDER — TRAMADOL HCL 50 MG PO TABS: 50.0000 mg | ORAL_TABLET | Freq: Four times a day (QID) | ORAL | 0 refills | Status: DC | PRN

## 2017-06-08 MED ORDER — KETOROLAC TROMETHAMINE 30 MG/ML IJ SOLN
30.0000 mg | Freq: Once | INTRAMUSCULAR | Status: AC
  Administered 2017-06-08: 30 mg via INTRAVENOUS
  Filled 2017-06-08: qty 1

## 2017-06-08 MED ORDER — CEPHALEXIN 500 MG PO CAPS: 500.0000 mg | ORAL_CAPSULE | Freq: Four times a day (QID) | ORAL | 0 refills | Status: DC

## 2017-06-08 NOTE — ED Notes (Signed)
Labs drawn by main lab 

## 2017-06-08 NOTE — ED Provider Notes (Signed)
[facility]-EMERGENCY DEPT Provider Note   CSN: [id number] Arrival date & time: 06/08/17  0732     History   Chief Complaint Chief Complaint  Patient presents with  . Abdominal Pain  . Back Pain    HPI Danielle Hayden is a 42 y.o. female.  Patient complains of right flank pain worse with inspiration.   The history is provided by the patient.  Abdominal Pain   This is a new problem. The current episode started yesterday. The problem occurs constantly. The problem has not changed since onset.The pain is associated with an unknown factor. Pertinent negatives include diarrhea, frequency, hematuria and headaches.  Back Pain   Associated symptoms include abdominal pain. Pertinent negatives include no chest pain and no headaches.    Past Medical History:  Diagnosis Date  . H/O malignant hyperthermia   . Malignant hyperthermia     There are no active problems to display for this patient.   Past Surgical History:  Procedure Laterality Date  . VENTRICULOPERITONEAL SHUNT      OB History    No data available       Home Medications    Prior to Admission medications   Medication Sig Start Date End Date Taking? Authorizing Provider  acetaminophen (TYLENOL) 500 MG tablet Take 1,000 mg by mouth daily as needed (PAIN).   Yes [provider]  benzocaine (ORAJEL) 10 % mucosal gel Use as directed 1 application in the mouth or throat as needed for mouth pain. Patient not taking: Reported on 06/08/2017 11/06/16   Caccavale, Sophia, PA-C  cephALEXin (KEFLEX) 500 MG capsule Take 1 capsule (500 mg total) by mouth 4 (four) times daily. 06/08/17   Bethann Berkshire, MD  diazepam (VALIUM) 5 MG tablet Take 1 tablet (5 mg total) by mouth every 6 (six) hours as needed for anxiety (spasms). Patient not taking: Reported on 06/08/2017 05/21/12   Pisciotta, Joni Reining, PA-C  famotidine (PEPCID) 20 MG tablet Take 1 tablet (20 mg total) by mouth daily. Patient not taking: Reported on  06/08/2017 05/06/16   Jacalyn Lefevre, MD  HYDROcodone-acetaminophen (NORCO/VICODIN) 5-325 MG tablet Take 1 tablet by mouth every 4 (four) hours as needed. Patient not taking: Reported on 06/08/2017 05/06/16   Jacalyn Lefevre, MD  hydrOXYzine (ATARAX/VISTARIL) 25 MG tablet Take 1 tablet (25 mg total) by mouth every 6 (six) hours. Patient not taking: Reported on 06/08/2017 01/27/16   Jaynie Crumble, PA-C  naproxen (NAPROSYN) 500 MG tablet Take 1 tablet (500 mg total) by mouth 2 (two) times daily. Patient not taking: Reported on 06/08/2017 11/06/16   Caccavale, Sophia, PA-C  ondansetron (ZOFRAN ODT) 4 MG disintegrating tablet 4mg  ODT q4 hours prn nausea/vomit 06/08/17   Bethann Berkshire, MD  predniSONE (DELTASONE) 20 MG tablet Take 2 tablets (40 mg total) by mouth daily. Patient not taking: Reported on 06/08/2017 01/27/16   Jaynie Crumble, PA-C  promethazine (PHENERGAN) 25 MG tablet Take 1 tablet (25 mg total) by mouth every 6 (six) hours as needed for nausea or vomiting. Patient not taking: Reported on 06/08/2017 12/27/13   Pisciotta, Joni Reining, PA-C  traMADol (ULTRAM) 50 MG tablet Take 1 tablet (50 mg total) by mouth every 6 (six) hours as needed. 06/08/17   Bethann Berkshire, MD    Family History History reviewed. No pertinent family history.  Social History Social History   Tobacco Use  . Smoking status: Current Every Day Smoker    Packs/day: 0.50    Years: 15.00    Pack years: 7.50  Types: Cigarettes  . Smokeless tobacco: Never Used  Substance Use Topics  . Alcohol use: Yes    Comment: occasionally  . Drug use: No     Allergies   Patient has no known allergies.   Review of Systems Review of Systems  Constitutional: Negative for appetite change and fatigue.  HENT: Negative for congestion, ear discharge and sinus pressure.   Eyes: Negative for discharge.  Respiratory: Negative for cough.   Cardiovascular: Negative for chest pain.  Gastrointestinal: Positive for abdominal pain. Negative  for diarrhea.  Genitourinary: Negative for frequency and hematuria.  Musculoskeletal: Positive for back pain.  Skin: Negative for rash.  Neurological: Negative for seizures and headaches.  Psychiatric/Behavioral: Negative for hallucinations.     Physical Exam Updated Vital Signs BP 117/78 (BP Location: Left Arm)   Pulse 88   Temp 97.9 F (36.6 C) (Oral)   Resp 17   SpO2 99%   Physical Exam  Constitutional: She is oriented to person, place, and time. She appears well-developed.  HENT:  Head: Normocephalic.  Eyes: Conjunctivae and EOM are normal. No scleral icterus.  Neck: Neck supple. No thyromegaly present.  Cardiovascular: Normal rate and regular rhythm. Exam reveals no gallop and no friction rub.  No murmur heard. Pulmonary/Chest: No stridor. She has no wheezes. She has no rales. She exhibits no tenderness.  Abdominal: She exhibits no distension. There is no tenderness. There is no rebound.  Genitourinary:  Genitourinary Comments: Tender right flank  Musculoskeletal: Normal range of motion. She exhibits no edema.  Lymphadenopathy:    She has no cervical adenopathy.  Neurological: She is oriented to person, place, and time. She exhibits normal muscle tone. Coordination normal.  Skin: No rash noted. No erythema.  Psychiatric: She has a normal mood and affect. Her behavior is normal.     ED Treatments / Results  Labs (all labs ordered are listed, but only abnormal results are displayed) Labs Reviewed  COMPREHENSIVE METABOLIC PANEL - Abnormal; Notable for the following components:      Result Value   Glucose, Bld 101 (*)    Calcium 8.5 (*)    All other components within normal limits  CBC - Abnormal; Notable for the following components:   WBC 11.1 (*)    Platelets 443 (*)    All other components within normal limits  URINALYSIS, ROUTINE W REFLEX MICROSCOPIC - Abnormal; Notable for the following components:   APPearance HAZY (*)    Ketones, ur 5 (*)    Leukocytes,  UA MODERATE (*)    Bacteria, UA RARE (*)    Squamous Epithelial / LPF 0-5 (*)    All other components within normal limits  URINE CULTURE  LIPASE, BLOOD  D-DIMER, QUANTITATIVE (NOT AT [facility])  TROPONIN I  POC URINE PREG, ED    EKG  EKG Interpretation None       Radiology Dg Chest 2 View  Result Date: 06/08/2017 CLINICAL DATA:  Left upper quadrant pain radiating to the back for 2 days. This morning she developed right upper quadrant pain as well. The patient reports 2 week history of cough. Current smoker. History of episode of malignant hyperthermia. EXAM: CHEST - 2 VIEW COMPARISON:  CT scan of the chest of May 06, 2016 FINDINGS: The lungs are adequately inflated and clear. The heart and pulmonary vascularity are normal. The mediastinum is normal in width. There is no pleural effusion. The bony thorax is unremarkable. 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 An area of discontinuity  is noted adjacent to the left correction right heart border and extends for a distance of 4 cm. This has been previously demonstrated. IMPRESSION: No active cardiopulmonary disease. Electronically Signed   By: David  [place] M.D.   On: 06/08/2017 16:33   Ct Renal Stone Study  Result Date: 06/08/2017 CLINICAL DATA:  Left-sided flank pain for 1 week EXAM: CT ABDOMEN AND PELVIS WITHOUT CONTRAST TECHNIQUE: Multidetector CT imaging of the abdomen and pelvis was performed following the standard protocol without IV contrast. COMPARISON:  05/06/2016. FINDINGS: Lower chest: No acute abnormality. Hepatobiliary: No focal liver abnormality is seen. No gallstones, gallbladder wall thickening, or biliary dilatation. Pancreas: Unremarkable. No pancreatic ductal dilatation or surrounding inflammatory changes. Spleen: Normal in size without focal abnormality. Adrenals/Urinary Tract: Adrenal glands are unremarkable. Kidneys are normal, without renal calculi, focal lesion, or hydronephrosis. Bladder is unremarkable.  Stomach/Bowel: The appendix is within normal limits. No obstructive or inflammatory changes are seen. Vascular/Lymphatic: No significant vascular findings are present. No enlarged abdominal or pelvic lymph nodes. Reproductive: Uterus is within normal limits. An IUD is noted within although somewhat horizontally oriented but stable from the prior exam. 5.3 cm cyst is noted within the right ovary. A 2.3 cm cyst in the left ovary is noted. Other: No ascites is noted. A small fat containing umbilical hernia is noted. Shunt catheter is noted in the upper abdomen although discontinuous with its more superior component. This is stable from the prior exam. Musculoskeletal: No acute bony abnormality is noted. IMPRESSION: Bilateral ovarian cysts right greater than left. Small fat containing umbilical hernia slightly enlarged when compared with the prior exam. Discontinuous shunt catheter stable from the prior study. Electronically Signed   By: Alcide Clever M.D.   On: 06/08/2017 16:09    Procedures Procedures (including critical care time)  Medications Ordered in ED Medications  HYDROcodone-acetaminophen (NORCO/VICODIN) 5-325 MG per tablet 1 tablet (1 tablet Oral Given 06/08/17 1527)  ketorolac (TORADOL) 30 MG/ML injection 30 mg (30 mg Intravenous Given 06/08/17 1831)  ondansetron (ZOFRAN) injection 4 mg (4 mg Intravenous Given 06/08/17 1832)  cefTRIAXone (ROCEPHIN) 1 g in sodium chloride 0.9 % 100 mL IVPB (0 g Intravenous Stopped 06/08/17 1923)     Initial Impression / Assessment and Plan / ED Course  I have reviewed the triage vital signs and the nursing notes.  Pertinent labs & imaging results that were available during my care of the patient were reviewed by me and considered in my medical decision making (see chart for details).     Labs including CBC chemistries chest x-ray CT renal all unremarkable except for ovarian cyst.  Urinalysis suggest possible urinary tract infection.  Patient will be discharged  home on Keflex Zofran Ultram  Final Clinical Impressions(s) / ED Diagnoses   Final diagnoses:  None    ED Discharge Orders        Ordered    cephALEXin (KEFLEX) 500 MG capsule  4 times daily     06/08/17 1952    ondansetron (ZOFRAN ODT) 4 MG disintegrating tablet     06/08/17 1952    traMADol (ULTRAM) 50 MG tablet  Every 6 hours PRN     06/08/17 1952       Bethann Berkshire, MD 06/08/17 1954

## 2017-06-08 NOTE — ED Provider Notes (Signed)
Patient placed in Quick Look pathway, seen and evaluated   Chief Complaint: Abdominal pain, back pain  HPI:   Left sided flank pain radiating around to front for two week, now sudden onset severe Right flank pain radiating around to RUQ, woke her up out of sleep.   ROS: No fevers, hematuira, dysuria or urgency (one)  Physical Exam:   Gen: No distress  Neuro: Awake and Alert  Skin: Warm    Focused Exam: TTpercussion over right  CVA.  No abd TTP.    Initiation of care has begun. The patient has been counseled on the process, plan, and necessity for staying for the completion/evaluation, and the remainder of the medical screening examination    Norman ClayHammond, Imo [place] W, PA-C 06/08/17 1526    Charlynne PanderYao, David Hsienta, MD 06/08/17 [phone]

## 2017-06-08 NOTE — ED Notes (Signed)
Bed: [facility] Expected date:  Expected time:  Means of arrival:  Comments: 

## 2017-06-08 NOTE — Discharge Instructions (Signed)
Follow-up with a family doctor or follow-up with Dr.Ottelin next week for recheck

## 2017-06-08 NOTE — ED Notes (Signed)
Pt ambulated to restroom for urine sample.

## 2017-06-08 NOTE — ED Triage Notes (Signed)
Patient c/o LUQ pain that radiates to her back for couple days that has been constant. This morning she has RUQ pain that radiated to her back while she was sleeping. denies any n/v/d or urinary problems.

## 2017-06-08 NOTE — ED Notes (Signed)
Bed: [facility]2 Expected date:  Expected time:  Means of arrival:  Comments: 

## 2017-06-10 LAB — URINE CULTURE

## 2018-03-03 IMAGING — CT CT ABD-PELV W/ CM
2 of 4 series · 11 of 46 positions shown, 12 images · IV contrast (Iodine)
Comparison: None.

CLINICAL DATA: Left chest pain radiating down left side and into
back.



[Series 501: routine, idose (2) · axial · 0.78mm/px · z∈[+31,+391]mm · 8 of 88 slices shown, 9 images]
[im 8/88  soft-tissue]
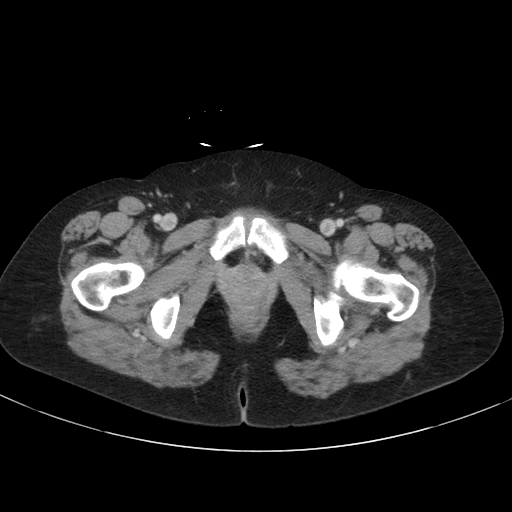
[im 8/88  bone]
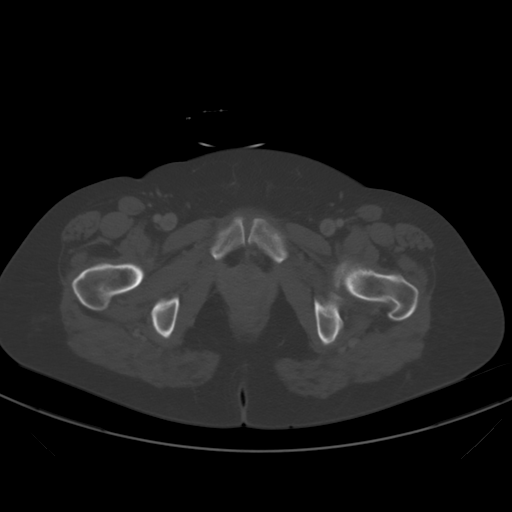
[im 19/88  soft-tissue]
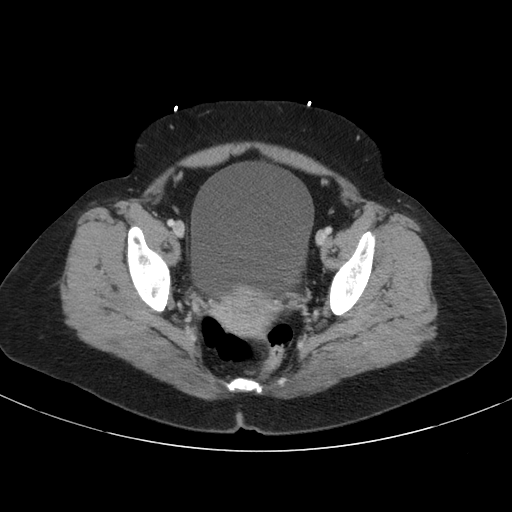
[im 30/88  soft-tissue]
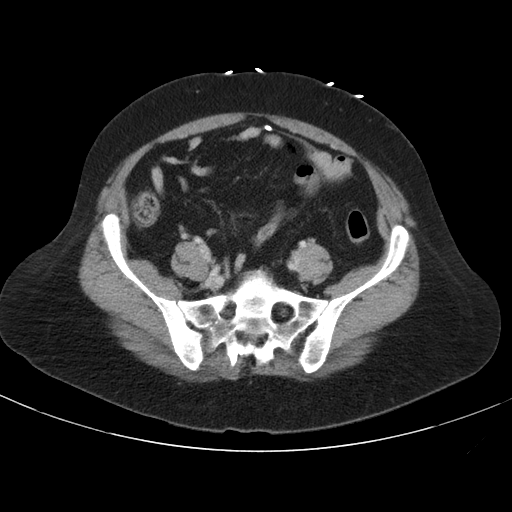
[im 40/88  soft-tissue]
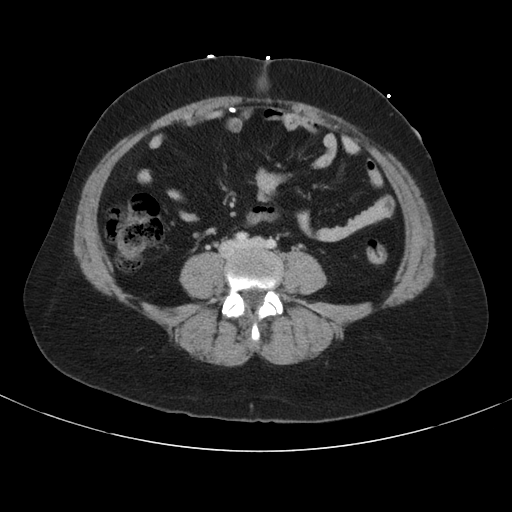
[im 48/88  soft-tissue]
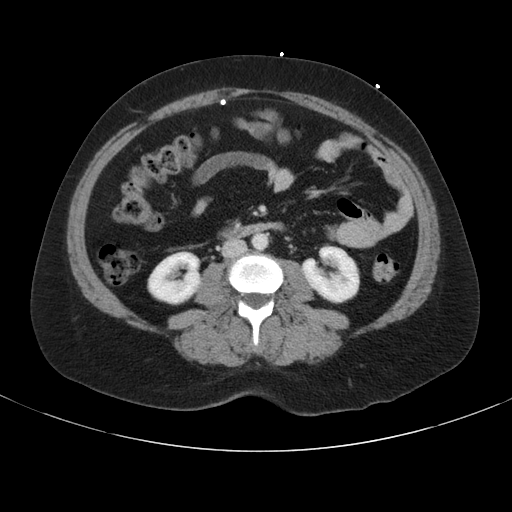
[im 59/88  soft-tissue]
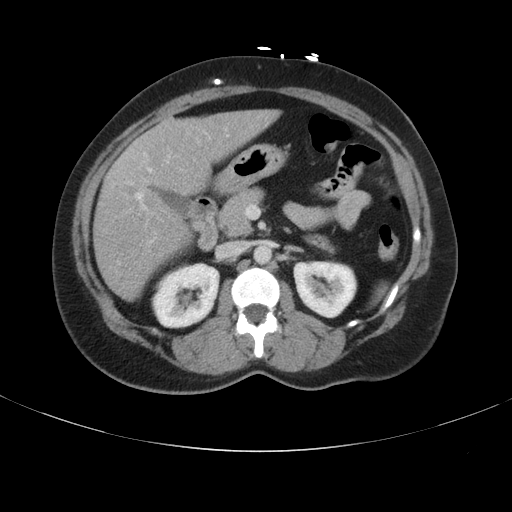
[im 69/88  soft-tissue]
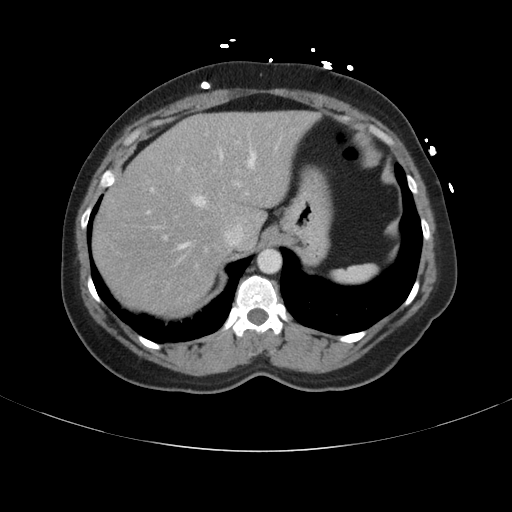
[im 80/88  soft-tissue]
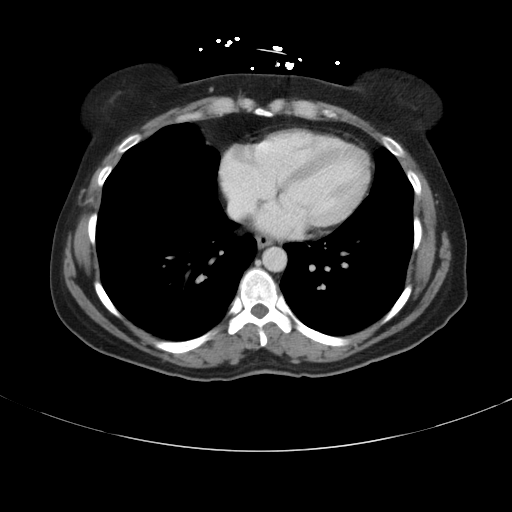

[Series 503: coronals, idose (2) · coronal · 0.45mm/px · 3 of 110 slices shown]
[im 37/110  soft-tissue]
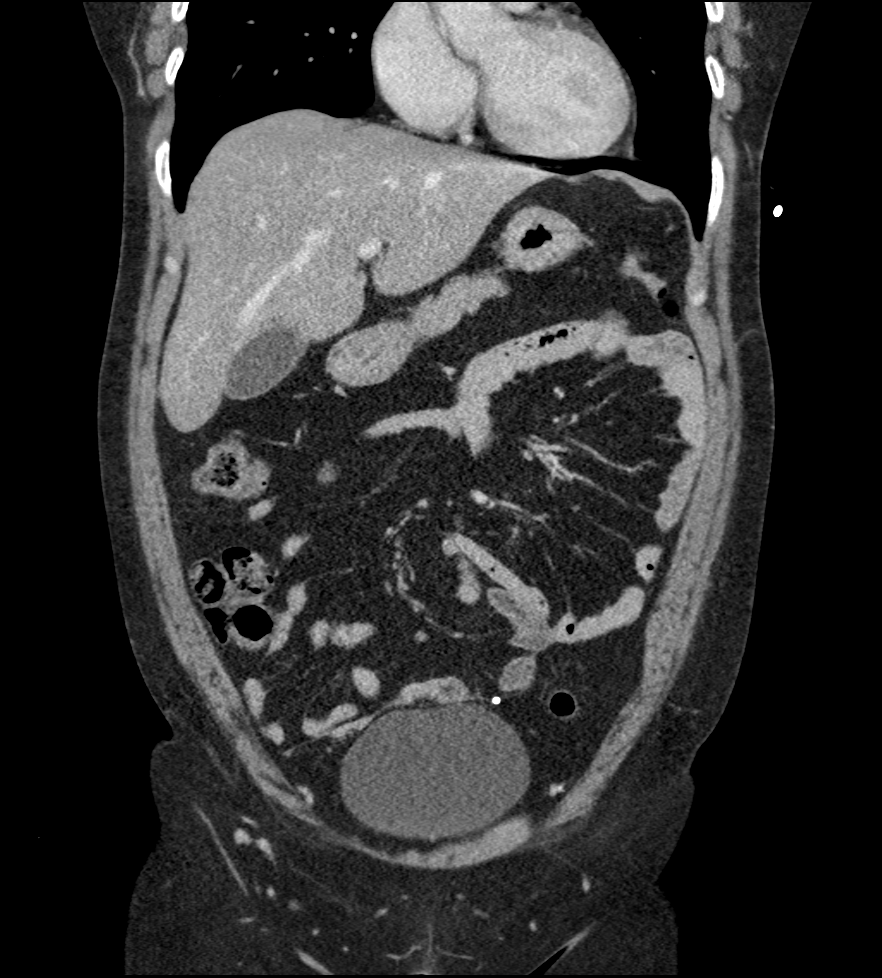
[im 49/110  soft-tissue]
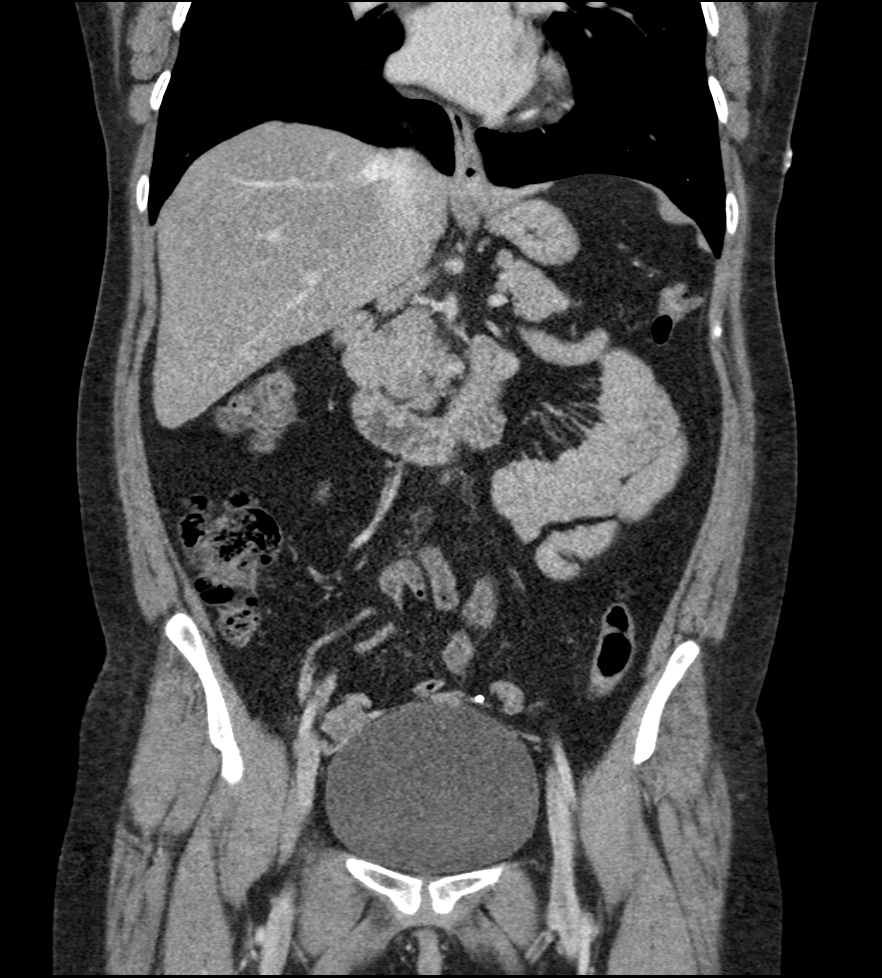
[im 61/110  soft-tissue]
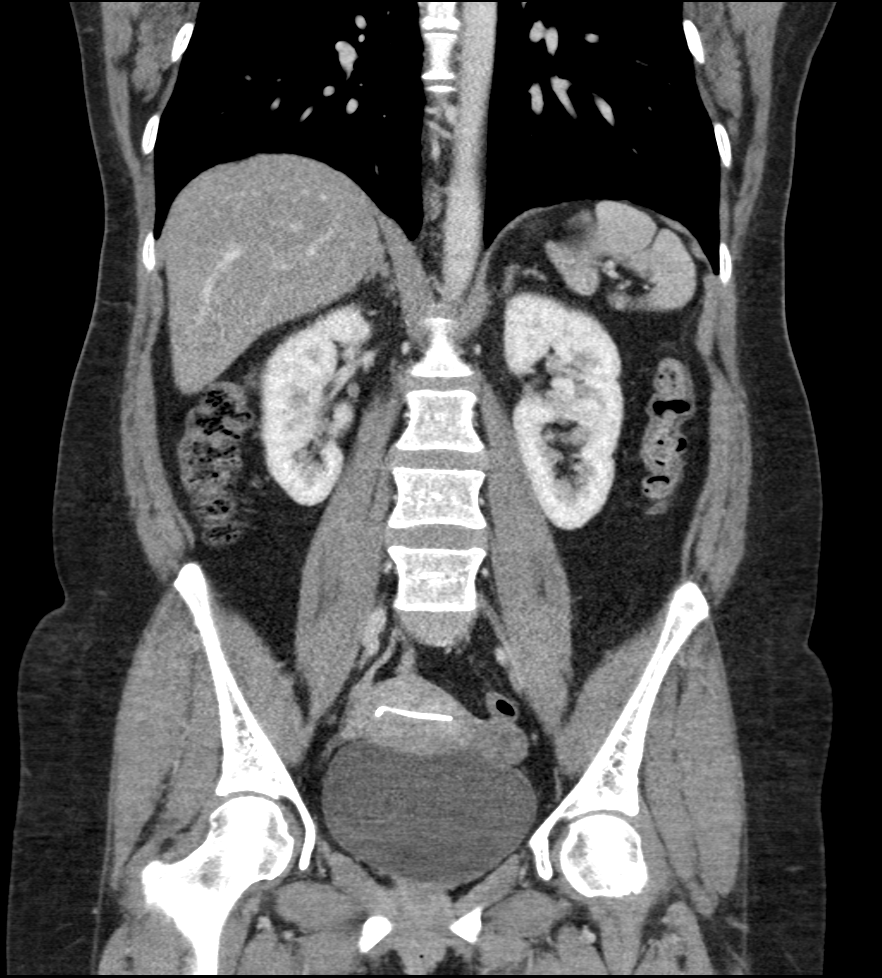

[11 of 46 positions shown; findings below may reference images not displayed]

FINDINGS: CTA CHEST FINDINGS

Cardiovascular: Heart is borderline in size. Aorta is normal
caliber. No dissection. No filling defects in the pulmonary arteries
to suggest pulmonary emboli.

Mediastinum/Nodes: No mediastinal, hilar, or axillary adenopathy.

Lungs/Pleura: Dependent atelectasis in the lungs. No confluent
opacities otherwise. No effusions.

Musculoskeletal: No acute bony abnormality or focal bone lesion.

Review of the MIP images confirms the above findings.

CT ABDOMEN and PELVIS FINDINGS

Hepatobiliary: No focal hepatic abnormality. Gallbladder
unremarkable.

Pancreas: No focal abnormality or ductal dilatation.

Spleen: No focal abnormality.  Normal size.

Adrenals/Urinary Tract: No adrenal abnormality. No focal renal
abnormality. No stones or hydronephrosis. Urinary bladder is
unremarkable.

Stomach/Bowel: Stomach, large and small bowel grossly unremarkable.

Vascular/Lymphatic: No evidence of aneurysm or adenopathy.

Reproductive: IUD is noted within the uterus, oriented transversely
across the uterus. No adnexal masses.

Other: Trace free fluid in the pelvis. Ventriculoperitoneal shunt is
noted in the right chest and abdominal wall extends into the
peritoneal space, terminating just above the urinary bladder. There
appears to be a discontinuous segment within the lower chest
subcutaneous soft tissues over approximately 3.5 cm length.

Musculoskeletal: No acute bony abnormality.

Review of the MIP images confirms the above findings.
IMPRESSION: No evidence of pulmonary embolus.

No acute findings in the chest, abdomen or pelvis.

Ventriculoperitoneal shunt catheter appears discontinuous in the
lower chest wall over approximately 3.5 cm segment.

## 2019-04-05 IMAGING — CT CT RENAL STONE PROTOCOL
2 of 4 series · 17 of 46 positions shown, 19 images · non-contrast
Comparison: 05/06/2016.

CLINICAL DATA: Left-sided flank pain for 1 week

EXAM:
CT ABDOMEN AND PELVIS WITHOUT CONTRAST
TECHNIQUE: Multidetector CT imaging of the abdomen and pelvis was performed
following the standard protocol without IV contrast.

[Series 2: axial st · axial · 0.72mm/px · z∈[-466,-120]mm · 14 of 79 slices shown, 16 images]
[im 5/79  soft-tissue]
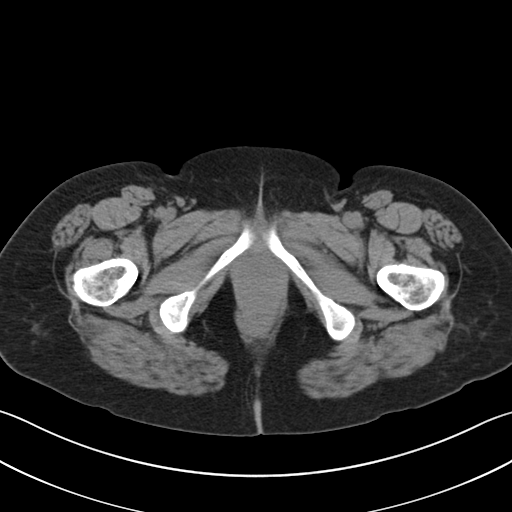
[im 5/79  bone]
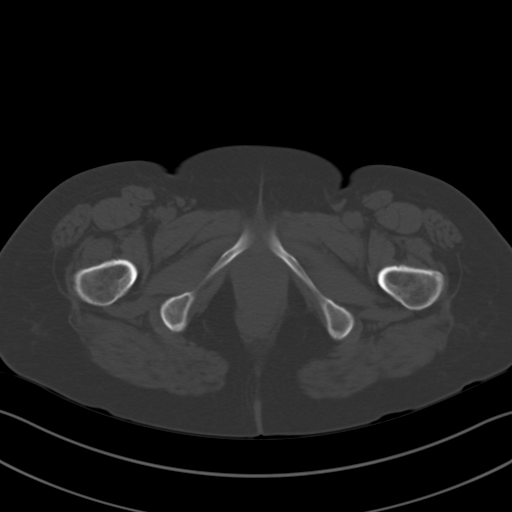
[im 9/79  soft-tissue]
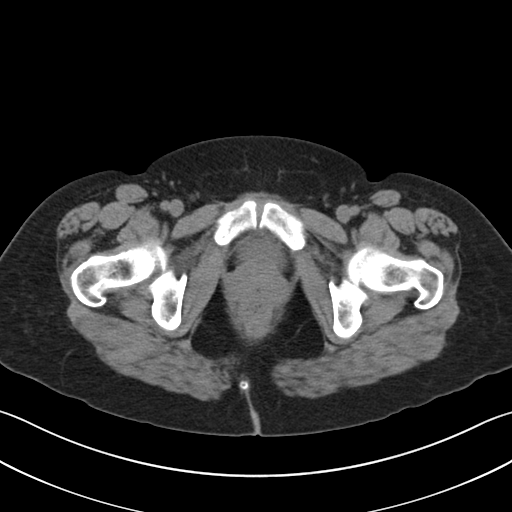
[im 18/79  soft-tissue]
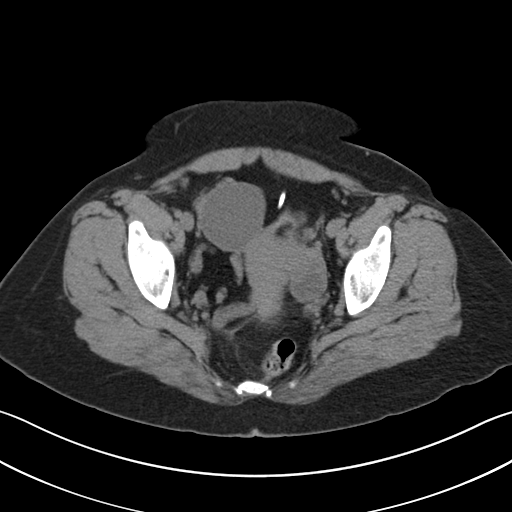
[im 22/79  soft-tissue]
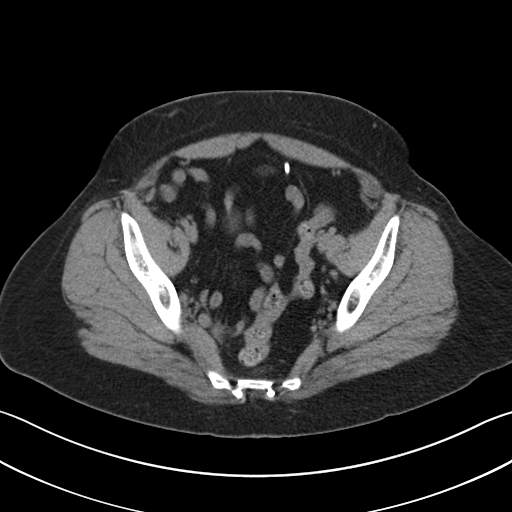
[im 27/79  soft-tissue]
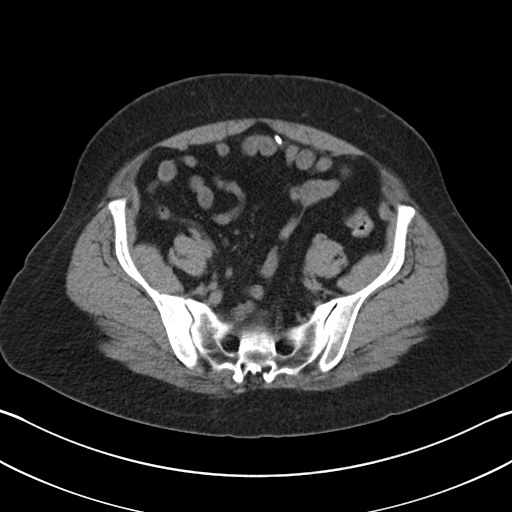
[im 31/79  soft-tissue]
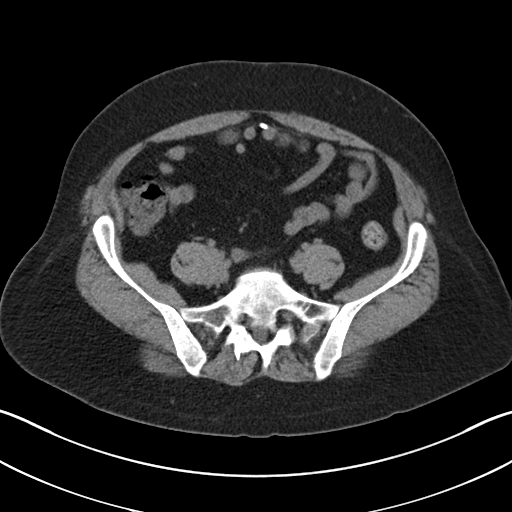
[im 35/79  soft-tissue]
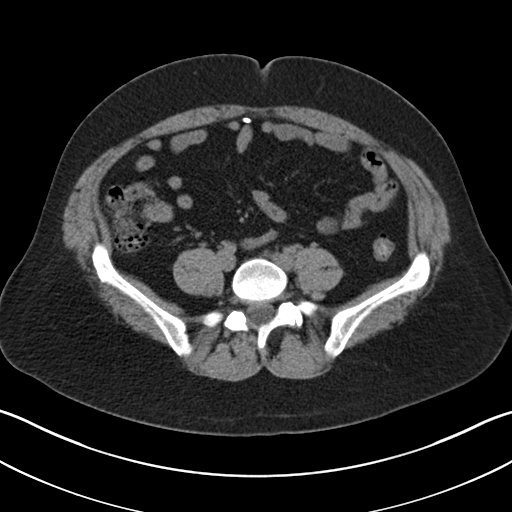
[im 44/79  soft-tissue]
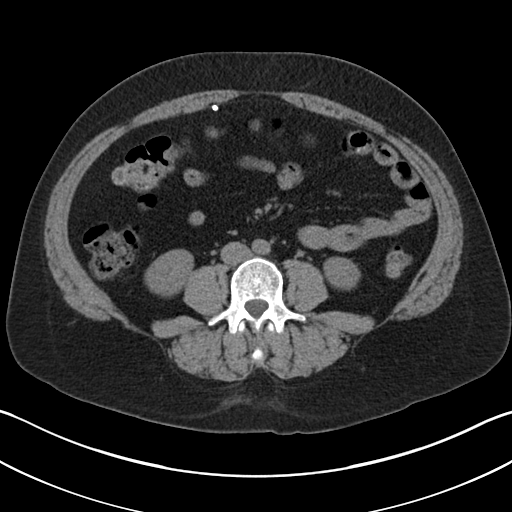
[im 48/79  soft-tissue]
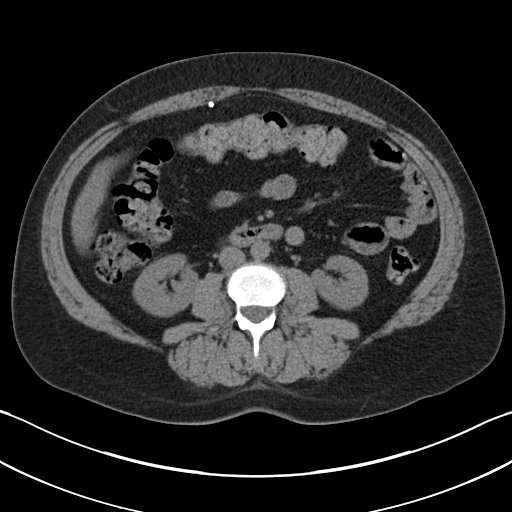
[im 48/79  bone]
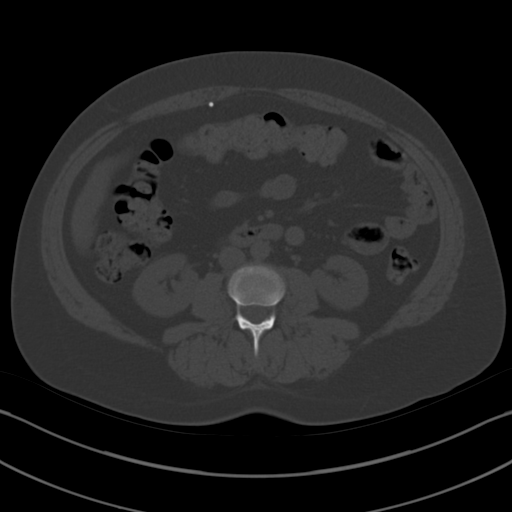
[im 53/79  soft-tissue]
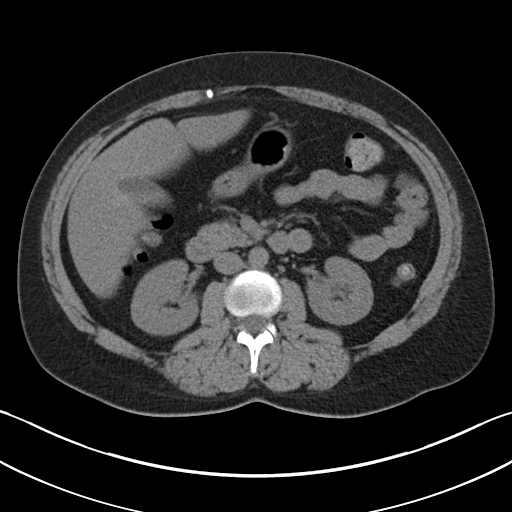
[im 57/79  soft-tissue]
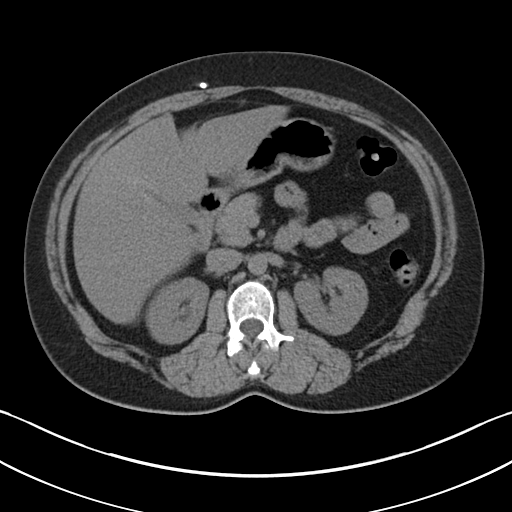
[im 61/79  soft-tissue]
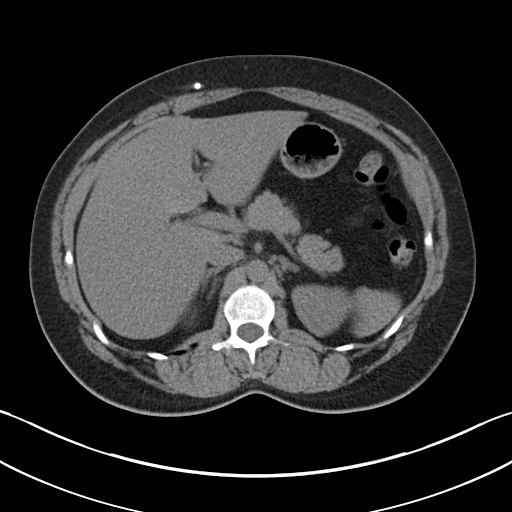
[im 70/79  soft-tissue]
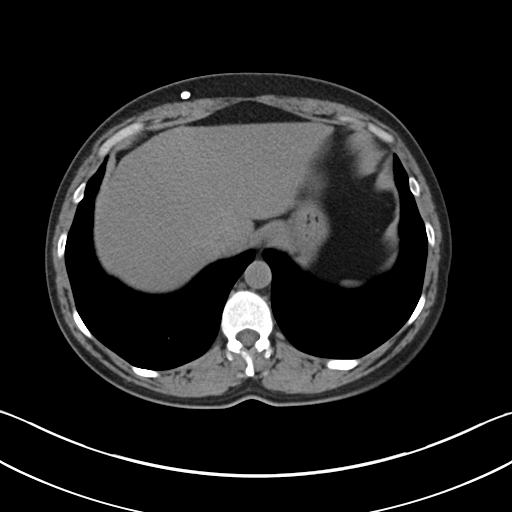
[im 74/79  soft-tissue]
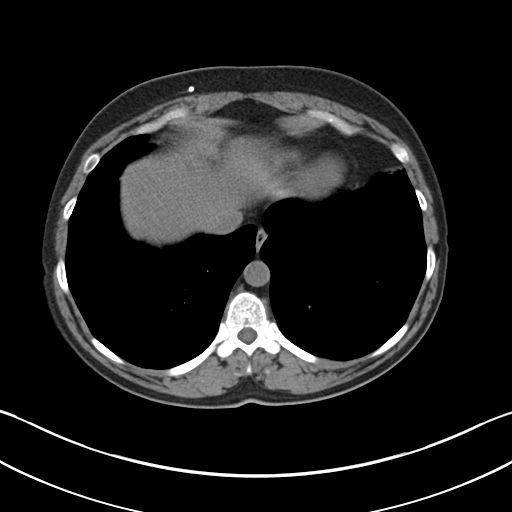

[Series 5: coronal · coronal · 0.76mm/px · 3 of 136 slices shown]
[im 46/136  soft-tissue]
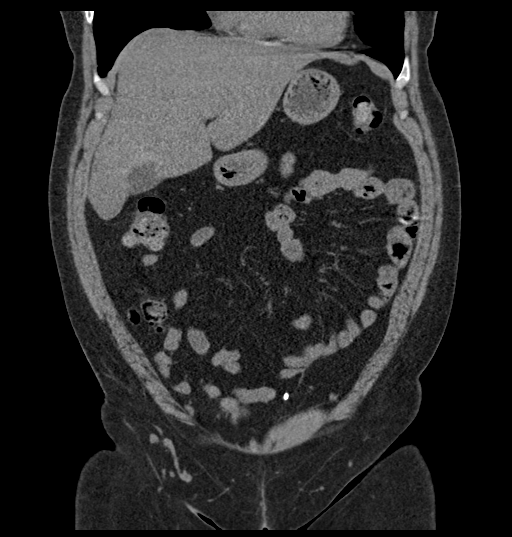
[im 61/136  soft-tissue]
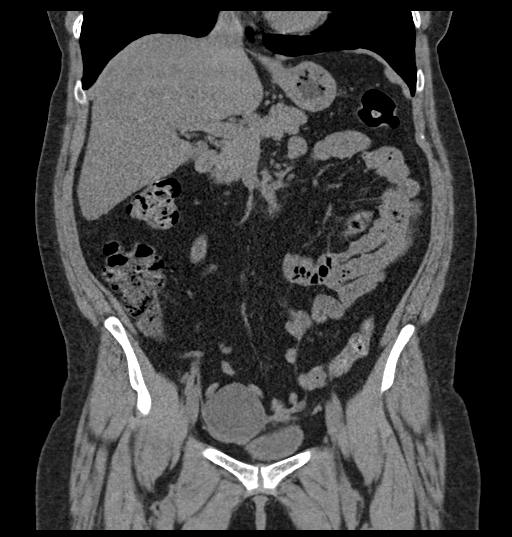
[im 76/136  soft-tissue]
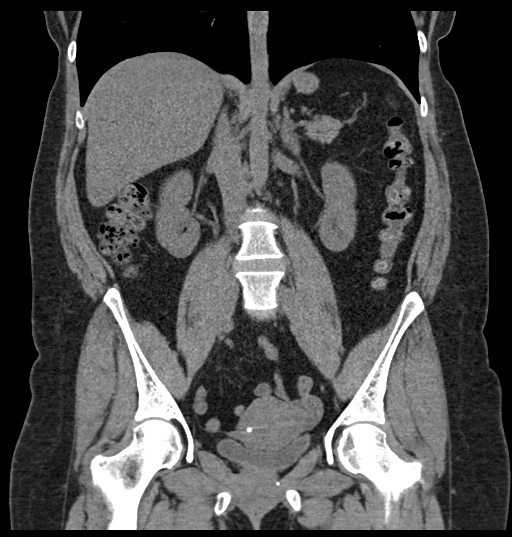

[17 of 46 positions shown; findings below may reference images not displayed]

FINDINGS: Lower chest: No acute abnormality.

Hepatobiliary: No focal liver abnormality is seen. No gallstones,
gallbladder wall thickening, or biliary dilatation.

Pancreas: Unremarkable. No pancreatic ductal dilatation or
surrounding inflammatory changes.

Spleen: Normal in size without focal abnormality.

Adrenals/Urinary Tract: Adrenal glands are unremarkable. Kidneys are
normal, without renal calculi, focal lesion, or hydronephrosis.
Bladder is unremarkable.

Stomach/Bowel: The appendix is within normal limits. No obstructive
or inflammatory changes are seen.

Vascular/Lymphatic: No significant vascular findings are present. No
enlarged abdominal or pelvic lymph nodes.

Reproductive: Uterus is within normal limits. An IUD is noted within
although somewhat horizontally oriented but stable from the prior
exam.. 5.3 cm cyst is noted within the right ovary. A 2.3 cm cyst in
the left ovary is noted.

Other: No ascites is noted. A small fat containing umbilical hernia
is noted. Shunt catheter is noted in the upper abdomen although
discontinuous with its more superior component. This is stable from
the prior exam.

Musculoskeletal: No acute bony abnormality is noted.
IMPRESSION: Bilateral ovarian cysts right greater than left.

Small fat containing umbilical hernia slightly enlarged when
compared with the prior exam.

Discontinuous shunt catheter stable from the prior study.

## 2021-11-25 ENCOUNTER — Emergency Department (HOSPITAL_COMMUNITY): Payer: Self-pay

## 2021-11-25 ENCOUNTER — Other Ambulatory Visit: Payer: Self-pay

## 2021-11-25 ENCOUNTER — Emergency Department (HOSPITAL_COMMUNITY)
Admission: EM | Admit: 2021-11-25 | Discharge: 2021-11-25 | Discharge status: Against Medical Advice | Payer: Self-pay | Attending: Emergency Medicine | Admitting: Emergency Medicine

## 2021-11-25 DIAGNOSIS — Z5321 Procedure and treatment not carried out due to patient leaving prior to being seen by health care provider: Secondary | ICD-10-CM | POA: Insufficient documentation

## 2021-11-25 DIAGNOSIS — K0889 Other specified disorders of teeth and supporting structures: Secondary | ICD-10-CM | POA: Insufficient documentation

## 2021-11-25 DIAGNOSIS — R0789 Other chest pain: Secondary | ICD-10-CM | POA: Insufficient documentation

## 2021-11-25 LAB — BASIC METABOLIC PANEL
Anion gap: 8 (ref 5–15)
BUN: 7 mg/dL (ref 6–20)
CO2: 21 mmol/L — ABNORMAL LOW (ref 22–32)
Calcium: 8.7 mg/dL — ABNORMAL LOW (ref 8.9–10.3)
Chloride: 107 mmol/L (ref 98–111)
Creatinine, Ser: 0.66 mg/dL (ref 0.44–1.00)
GFR, Estimated: >60 mL/min (ref >60)
Glucose, Bld: 106 mg/dL — ABNORMAL HIGH (ref 70–99)
Potassium: 4.2 mmol/L (ref 3.5–5.1)
Sodium: 136 mmol/L (ref 135–145)

## 2021-11-25 LAB — CBC
HCT: 42.6 % (ref 36.0–46.0)
Hemoglobin: 14.6 g/dL (ref 12.0–15.0)
MCH: 33.6 pg (ref 26.0–34.0)
MCHC: 34.3 g/dL (ref 30.0–36.0)
MCV: 97.9 fL (ref 80.0–100.0)
Platelets: 394 10*3/uL (ref 150–400)
RBC: 4.35 MIL/uL (ref 3.87–5.11)
RDW: 13.2 % (ref 11.5–15.5)
WBC: 12.2 10*3/uL — ABNORMAL HIGH (ref 4.0–10.5)
nRBC: 0 % (ref 0.0–0.2)

## 2021-11-25 LAB — TROPONIN I (HIGH SENSITIVITY): Troponin I (High Sensitivity): 2 ng/L (ref <18)

## 2021-11-25 NOTE — ED Provider Triage Note (Signed)
Emergency Medicine Provider Triage Evaluation Note  Danielle Hayden , a 46 y.o. female  was evaluated in triage.  Pt complains of left-sided dental pain and chest pain.  Patient states that she believes she has an abscess forming in the lower left portion of her mouth.  She states he has a history of dental abscesses and this feels similar.  She also complains of burning to the roof of her mouth and tongue.  Patient states that upon arrival here to the emergency department she developed sharp chest pain in the center of her chest.  She rates it 8 out of 10 in severity.  She denies any radiation of symptoms.  Denies shortness of breath, abdominal pain, nausea, vomiting  Review of Systems  Positive: As above Negative: As above  Physical Exam  BP (!) 193/98 (BP Location: Left Arm)   Pulse 87   Temp 99 F (37.2 C) (Oral)   Resp 16   SpO2 98%  Gen:   Awake, no distress   Resp:  Normal effort  MSK:   Moves extremities without difficulty  Other:    Medical Decision Making  Medically screening exam initiated at 11:02 AM.  Appropriate orders placed.  [name] was informed that the remainder of the evaluation will be completed by another provider, this initial triage assessment does not replace that evaluation, and the importance of remaining in the ED until their evaluation is complete.     Darrick Grinder, PA-C 11/25/21 1103

## 2021-11-25 NOTE — ED Triage Notes (Signed)
Pt presents with left lower dental pain and swelling and feels she has a recurrent abscess. Also feels a sharp mid sternal chest pain, worse with deep breath, non radiating.

## 2021-11-25 NOTE — ED Notes (Signed)
Called pt 6X no answer pt left the hospital.

## 2021-11-26 ENCOUNTER — Emergency Department (HOSPITAL_COMMUNITY)
Admission: EM | Admit: 2021-11-26 | Discharge: 2021-11-26 | Disposition: A | Discharge status: Home / Self Care | Payer: Self-pay | Attending: Emergency Medicine | Admitting: Emergency Medicine

## 2021-11-26 ENCOUNTER — Encounter (HOSPITAL_COMMUNITY): Payer: Self-pay | Admitting: Emergency Medicine

## 2021-11-26 ENCOUNTER — Other Ambulatory Visit: Payer: Self-pay

## 2021-11-26 DIAGNOSIS — R000 Tachycardia, unspecified: Secondary | ICD-10-CM | POA: Insufficient documentation

## 2021-11-26 DIAGNOSIS — Z720 Tobacco use: Secondary | ICD-10-CM | POA: Insufficient documentation

## 2021-11-26 DIAGNOSIS — I10 Essential (primary) hypertension: Secondary | ICD-10-CM | POA: Insufficient documentation

## 2021-11-26 DIAGNOSIS — Z79899 Other long term (current) drug therapy: Secondary | ICD-10-CM | POA: Insufficient documentation

## 2021-11-26 DIAGNOSIS — K029 Dental caries, unspecified: Secondary | ICD-10-CM | POA: Insufficient documentation

## 2021-11-26 MED ORDER — AMLODIPINE BESYLATE 5 MG PO TABS: 5.0000 mg | ORAL_TABLET | Freq: Every day | ORAL | 0 refills | Status: AC

## 2021-11-26 MED ORDER — NAPROXEN 500 MG PO TABS: 500.0000 mg | ORAL_TABLET | Freq: Two times a day (BID) | ORAL | 0 refills | Status: AC

## 2021-11-26 MED ORDER — AMOXICILLIN-POT CLAVULANATE 875-125 MG PO TABS
1.0000 | ORAL_TABLET | Freq: Once | ORAL | Status: AC
  Administered 2021-11-26: 1 via ORAL
  Filled 2021-11-26: qty 1

## 2021-11-26 MED ORDER — AMOXICILLIN-POT CLAVULANATE 875-125 MG PO TABS: 1.0000 | ORAL_TABLET | Freq: Two times a day (BID) | ORAL | 0 refills | Status: AC

## 2021-11-26 MED ORDER — AMLODIPINE BESYLATE 5 MG PO TABS
5.0000 mg | ORAL_TABLET | Freq: Once | ORAL | Status: AC
  Administered 2021-11-26: 5 mg via ORAL
  Filled 2021-11-26: qty 1

## 2021-11-26 NOTE — ED Provider Notes (Signed)
[facility]-EMERGENCY DEPT Provider Note   CSN: [id number] Arrival date & time: 11/26/21  3785     History  Chief Complaint  Patient presents with   Dental Pain    Danielle Hayden is a 46 y.o. female with history of VP shunt and chronic tobacco use presenting today with dental pain.  Pain started yesterday of the left lower jaw.  Later in the day she felt a "popping sensation" in her mouth which she believes was due to an abscess having ruptured.  Immediately spit and rinsed her mouth out in the sink, and has been icing her face on/off since.  Has had multiple dental pains and/or abscesses in the past.  Denies any bleeding from the gums or difficulty swallowing.  Without fever, neck stiffness, chills, facial swelling, palpitations, neck swelling, or tongue elevation.  No recent antibiotic use.  Does not like the use of benzocaine/orajel for dental pains.  Last saw dentist 1 year ago.  The history is provided by the patient and medical records.  Dental Pain    Home Medications Prior to Admission medications   Medication Sig Start Date End Date Taking? Authorizing Provider  amLODipine (NORVASC) 5 MG tablet Take 1 tablet (5 mg total) by mouth daily. 11/26/21  Yes Cecil Cobbs, PA-C  amoxicillin-clavulanate (AUGMENTIN) 875-125 MG tablet Take 1 tablet by mouth every 12 (twelve) hours. 11/26/21  Yes Cecil Cobbs, PA-C  naproxen (NAPROSYN) 500 MG tablet Take 1 tablet (500 mg total) by mouth 2 (two) times daily. 11/26/21  Yes Cecil Cobbs, PA-C  acetaminophen (TYLENOL) 500 MG tablet Take 1,000 mg by mouth daily as needed (PAIN).    [provider]      Allergies    Patient has no known allergies.    Review of Systems   Review of Systems  HENT:  Positive for dental problem.     Physical Exam Updated Vital Signs BP (!) 151/100   Pulse 89   Temp 98 F (36.7 C) (Oral)   Resp 18   SpO2 98%  Physical Exam Vitals and nursing note reviewed.   Constitutional:      General: She is not in acute distress.    Appearance: Normal appearance. She is well-developed. She is not ill-appearing, toxic-appearing or diaphoretic.  HENT:     Head: Normocephalic and atraumatic.     Jaw: Tenderness (Mild mid-left lower jaw tenderness) present. No trismus.     Mouth/Throat:     Mouth: Mucous membranes are moist.     Dentition: Abnormal dentition. Dental caries present. No dental abscesses.     Tongue: Tongue does not deviate from midline.     Pharynx: Oropharynx is clear. No oropharyngeal exudate or posterior oropharyngeal erythema.     Comments: No appreciated tongue swelling, deviation, or elevation.  Mild gingival swelling appreciated.  No dental abscess appreciated.  Abnormal and poor dentition overall with multiple dental caries and several dental fractures.  Airway patent.  Without significant erythema of the oropharynx. Eyes:     Conjunctiva/sclera: Conjunctivae normal.  Neck:     Comments: Without significant neck swelling or enlargement.  Neck very supple, no torticollis or meningismus. Cardiovascular:     Rate and Rhythm: Normal rate and regular rhythm.     Pulses: Normal pulses.     Heart sounds: No murmur heard. Pulmonary:     Effort: Pulmonary effort is normal. No respiratory distress.     Breath sounds: Normal breath sounds.  Abdominal:  Palpations: Abdomen is soft.     Tenderness: There is no abdominal tenderness.  Musculoskeletal:        General: No swelling.     Cervical back: Neck supple. No rigidity.  Skin:    General: Skin is warm and dry.     Capillary Refill: Capillary refill takes less than 2 seconds.  Neurological:     Mental Status: She is alert.  Psychiatric:        Mood and Affect: Mood normal.     ED Results / Procedures / Treatments   Labs (all labs ordered are listed, but only abnormal results are displayed) Labs Reviewed - No data to  display  EKG None  Radiology None  Procedures Procedures    Medications Ordered in ED Medications  amoxicillin-clavulanate (AUGMENTIN) 875-125 MG per tablet 1 tablet (1 tablet Oral Given 11/26/21 0932)  amLODipine (NORVASC) tablet 5 mg (5 mg Oral Given 11/26/21 9476)    ED Course/ Medical Decision Making/ A&P                           Medical Decision Making Risk Prescription drug management.   46 y.o. female presents to the ED for concern of Dental Pain   This involves an extensive number of treatment options, and is a complaint that carries with it a high risk of complications and morbidity.  The emergent differential diagnosis prior to evaluation includes, but is not limited to: dental pain, dental abscess, dental fracture  This is not an exhaustive differential.   Past Medical History / Co-morbidities / Social History: Hx of VP shunt, prior malignant hyperthermia, daily tobacco use, and poor dentition Social Determinants of Health include: Daily tobacco use, for cessation counseling was provided.  Without PCP or dentist, resources provided.  Additional History:  Obtained by chart review.  Notably prior ED visits for dental pain.  Lab Tests: None  Imaging Studies: None  ED Course: Pt well-appearing on exam.  Nonseptic, nontoxic appearing in NAD.  Patient presenting with toothache.  Nonseptic, non-ill appearing, in NAD.  Afebrile.  Poor dentition overall.  No visible purulent discharge or gross abscess.  Tongue not swollen or elevated.  Neck without swelling or significant tenderness, is very supple on exam.  Very mildly reduced TMJ ROM due to elicited pain.  Mild tenderness of affected area.  Exam unconcerning for Ludwig's angina or spread of infection.  Airway intact.  Also noticeably hypertensive and tachycardic.  Without chest pain, shortness of breath, headache, vision changes, dizziness, or lightheadedness.  Appears to be asymptomatic hypertension.  Provided first  dose of Augmentin and one dose of amlodipine.  Plan to reassess.   Upon re-evaluation, pt status remains unchanged.  Blood pressure and heart rate has improved.  Recommended close follow-up with PCP and short trial of amlodipine for blood pressure management.  Pain managed in ED.  Plan to treat with PCN and anti-inflammatories.  Urged patient to follow-up closely with dentist.  Resources and education provided.  Pt satisfied with today's encounter.  Patient in NAD and in good condition at time of discharge.  Disposition: After consideration the patient's encounter today, I do not feel today's workup suggests an emergent condition requiring admission or immediate intervention beyond what has been performed at this time.  Safe for discharge; instructed to return immediately for worsening symptoms, change in symptoms or any other concerns.  I have reviewed the patients home medicines and have made adjustments as needed.  Discussed course of treatment with the patient, whom demonstrated understanding.  Patient in agreement and has no further questions.    I discussed this case with my attending physician Dr. Elpidio Anis, who agreed with the proposed treatment course and cosigned this note including patient's presenting symptoms, physical exam, and planned diagnostics and interventions.  Attending physician stated agreement with plan or made changes to plan which were implemented.     This chart was dictated using voice recognition software.  Despite best efforts to proofread, errors can occur which can change the documentation meaning.         Final Clinical Impression(s) / ED Diagnoses Final diagnoses:  Pain due to dental caries  Tachycardia    Rx / DC Orders ED Discharge Orders          Ordered    amoxicillin-clavulanate (AUGMENTIN) 875-125 MG tablet  Every 12 hours        11/26/21 0934    naproxen (NAPROSYN) 500 MG tablet  2 times daily        11/26/21 0934    amLODipine (NORVASC) 5 MG  tablet  Daily        11/26/21 0945              Cecil Cobbs, PA-C 11/26/21 1001    Mardene Sayer, MD 11/26/21 [phone]

## 2021-11-26 NOTE — ED Triage Notes (Signed)
Pt reports dental pain that began yesterday. Pt reports she felt what believes to be an abscess bust yesterday.

## 2021-11-26 NOTE — Discharge Instructions (Addendum)
You been provided a list of dental resources in the area.  Please follow-up closely for reevaluation and continued medical management.  Antibiotics also been sent to the pharmacy by the name of Augmentin.  Please take 1 every 12 hours for the next week.  Always take with plenty food and water.  You have also been prescribed an anti-inflammatory by the name of naproxen.  You may take 1 tablet every 12 hours as needed for pain relief.  Always take with plenty of food and water.  Do not take ibuprofen or Motrin on top of this, stay on same medication family.  Though you may take Tylenol in addition to the naproxen.  You are also noticeably hypertensive today in the emergency department.  Thus you have also been provided a short course of amlodipine for blood pressure management, please follow-up with your PCP within the next 2 to 3 days for reevaluation and continued medical management.  Further information of amlodipine has also been provided for you to review at your leisure.  If you do not have a PCP, a list of resources been provided for you below.  Return to the ED for any new or worsening symptoms as discussed   If you do not have a doctor see the list below.  RESOURCE GUIDE  Chronic Pain Problems: Contact [facility]  [phone] Patients need to be referred by their primary care doctor.  Insufficient Money for Medicine: Contact United Way:  call "211" or [facility] [phone].  No Primary Care Doctor: Call [facility]  [phone] - can help you locate a primary care doctor that  accepts your insurance, provides certain services, etc. Physician Referral Service- [phone] Agencies that provide inexpensive medical care: [facility]  [phone] [facility] Internal Medicine  [phone] [facility]  [phone] [facility]  [phone] Planned Parenthood  [phone] [facility]  [phone]  Medicaid-accepting [facility] Providers: [facility]- [address], [address]  [phone], Mon-Fri 9am-7pm, Sat 9am-1pm [facility]- [address] [place], [address] [place]  [phone] [facility]- [address], [address] [place]  [phone] [facility]- [address]  [phone] Renaye Rakers- [address] [place], [address], [phone]  Only accepts [place] patients after they have their name  applied to their card  Self Pay (no insurance) in [facility]: Sickle Cell Patients: Dr Willey Blade, [facility] Internal Medicine  [address] [place], [phone] [facility]- [address] [place]  [phone]       [facility]- [address]       -     [facility]- see information above (Speak to [employer] if you do not have insurance)       -  [facility]- [address] [place], [phone]       -  [facility]- [address],  [phone]       -  [facility]- [address], [phone]       -  Dr Julio Sicks-  [address], [place], [phone]       -  [facility]- [address], [phone]       -  [facility]- [address], [phone], also [address]  [address], [phone]       -    [facility]- [address] [place], [phone], 1st & 3rd Saturday   every month, 10am-1pm  1) Find a Doctor and Pay Out of Pocket Although you won't have to find out who is covered by your insurance plan, it is a good idea to ask around and get recommendations. You will then need to call the office and see if the doctor you have chosen will accept you as a new patient and what types of options they offer for patients who are self-pay. Some doctors offer discounts or will set up payment plans for their patients who do not have insurance, but you will need to ask so you aren't surprised when you get to your appointment.  2)  Contact Your Local Health Department Not all health departments have doctors that can see patients for sick visits, but many do, so it is worth a call to see if yours does. If you don't know where your local health department is, you can check in your phone book. The CDC also has a tool to help you locate your state's health department, and many state websites also have listings of all of their local health departments.  3) Find a Walk-in Clinic If your illness is not likely to be very severe or complicated, you may want to try a walk in clinic. These are popping up all over the country in pharmacies, drugstores, and shopping centers. They're usually staffed by nurse practitioners or physician assistants that have been trained to treat common illnesses and complaints. They're usually fairly quick and inexpensive. However, if you have serious medical issues or chronic medical problems, these are probably not your best option  STD Testing [facility] Department of [facility], STD Clinic, [address] [place], phone [phone] or [phone].  Monday - Friday, call for an appointment. [facility] Department of [employer], STD Clinic, [address], [place], phone [phone] or [phone].  Monday - Friday, call for an appointment.  Abuse/Neglect: [facility] Child Abuse Hotline 220-063-0599 [facility] Child Abuse Hotline 539-392-4267 (After Hours)  Emergency Shelter:  [facility] 270-179-6111  Maternity Homes: [facility] at the [place] [facility] [phone] [facility] [phone]  MRSA Hotline #:   970-705-8571  [facility] Resources  [facility]     United Way                          [facility] Dept. [address] [place]                       [address]      [address] [place] [address]  [place]                                                Cristobal Goldmann Phone:  [phone]  Phone:  [phone]                 Phone:  [phone]  [facility], [phone] [facility]- [phone]       -     [facility] in [place], [address]                                  [phone], [facility] Child Abuse Hotline 657-285-0322 or (731)164-4026 (After Hours)  Behavioral Health Services  Substance Abuse Resources: Alcohol and Drug Services  (781)180-6078 [facility] [phone] [facility] [place] [phone] Floydene Flock [phone] Residential & Outpatient Substance Abuse Program  (931)221-7428  Psychological Services: [facility]  [phone] [facility]  [phone] [facility] ([phone] [place]. [address], [place], ACCESS LINE: (619) 069-3973 or (332)790-5595, [employer]  Dental Assistance  Patients with Medicaid: [facility] [phone] [address]                                           [phone] [address] [employer] Phone:  [phone]                                                  Phone:  [phone]  If unable to pay or uninsured, contact:  [facility] or [facility]. to become qualified for the adult dental clinic.  Patients with Medicaid: [facility] [phone] [address]n Qu[address], [phone] [address] [phone]  If unable to pay, or uninsured, contact [facility] (424)[phone]) or [facility] Department [phone])[phone] in [place], [phone] in [facility]) to become qualified for the adult dental clinic  Other Low-Cost Community Dental Services: [facility]- [address] [place], [place] [employer] [place], [place], [address], [phone], [phone], 2nd and 4th Thursday of the month at 6:30am.  10 clients each day by  appointment, can sometimes see walk-in patients if someone does not show for an appointment. [facility]- [address] [place], [place], [address], [phone] [facility] [address], [place], [place], [address], [phone] [facility] Health Department- [phone] [facility] Health Department- [phone] [facility] [phone]
